# Patient Record
Sex: Female | Born: 1960 | Race: Black or African American | Hispanic: No | Marital: Single | Smoking: Never smoker
Health system: Southern US, Community
[De-identification: ages and names within clinical notes are randomized; demographics above are authoritative.]

## PROBLEM LIST (undated history)

## (undated) DIAGNOSIS — Z515 Encounter for palliative care: Secondary | ICD-10-CM

## (undated) DIAGNOSIS — G35 Multiple sclerosis: Secondary | ICD-10-CM

## (undated) DIAGNOSIS — N319 Neuromuscular dysfunction of bladder, unspecified: Secondary | ICD-10-CM

## (undated) DIAGNOSIS — J189 Pneumonia, unspecified organism: Secondary | ICD-10-CM

## (undated) DIAGNOSIS — F09 Unspecified mental disorder due to known physiological condition: Secondary | ICD-10-CM

## (undated) HISTORY — PX: KNEE ARTHROSCOPY: SHX127

## (undated) HISTORY — PX: APPENDECTOMY: SHX54

## (undated) HISTORY — PX: COLON SURGERY: SHX602

---

## 1998-07-27 ENCOUNTER — Emergency Department (HOSPITAL_COMMUNITY): Admission: EM | Admit: 1998-07-27 | Discharge: 1998-07-27 | Payer: Self-pay | Admitting: Emergency Medicine

## 1999-02-17 ENCOUNTER — Emergency Department (HOSPITAL_COMMUNITY): Admission: EM | Admit: 1999-02-17 | Discharge: 1999-02-17 | Payer: Self-pay | Admitting: Emergency Medicine

## 1999-02-17 ENCOUNTER — Encounter: Payer: Self-pay | Admitting: Emergency Medicine

## 1999-08-10 ENCOUNTER — Emergency Department (HOSPITAL_COMMUNITY): Admission: EM | Admit: 1999-08-10 | Discharge: 1999-08-10 | Payer: Self-pay | Admitting: Emergency Medicine

## 1999-11-18 ENCOUNTER — Encounter: Admission: RE | Admit: 1999-11-18 | Discharge: 1999-11-18 | Payer: Self-pay | Admitting: Neurology

## 1999-11-18 ENCOUNTER — Encounter: Payer: Self-pay | Admitting: Neurology

## 1999-12-29 ENCOUNTER — Encounter: Admission: RE | Admit: 1999-12-29 | Discharge: 2000-01-19 | Payer: Self-pay | Admitting: Neurology

## 2000-05-16 ENCOUNTER — Inpatient Hospital Stay (HOSPITAL_COMMUNITY): Admission: AD | Admit: 2000-05-16 | Discharge: 2000-05-21 | Payer: Self-pay | Admitting: Neurology

## 2000-05-25 ENCOUNTER — Encounter: Admission: RE | Admit: 2000-05-25 | Discharge: 2000-08-23 | Payer: Self-pay | Admitting: Neurology

## 2000-10-17 ENCOUNTER — Ambulatory Visit (HOSPITAL_COMMUNITY): Admission: RE | Admit: 2000-10-17 | Discharge: 2000-10-17 | Payer: Self-pay | Admitting: Neurology

## 2000-10-17 ENCOUNTER — Encounter: Payer: Self-pay | Admitting: Neurology

## 2001-05-24 ENCOUNTER — Ambulatory Visit (HOSPITAL_COMMUNITY): Admission: RE | Admit: 2001-05-24 | Discharge: 2001-05-24 | Payer: Self-pay | Admitting: Family Medicine

## 2001-05-24 ENCOUNTER — Encounter: Payer: Self-pay | Admitting: Family Medicine

## 2003-04-14 ENCOUNTER — Emergency Department (HOSPITAL_COMMUNITY): Admission: EM | Admit: 2003-04-14 | Discharge: 2003-04-15 | Payer: Self-pay | Admitting: Emergency Medicine

## 2003-04-14 ENCOUNTER — Encounter: Payer: Self-pay | Admitting: Emergency Medicine

## 2006-10-07 ENCOUNTER — Encounter: Admission: RE | Admit: 2006-10-07 | Discharge: 2006-11-01 | Payer: Self-pay | Admitting: Neurology

## 2006-11-02 ENCOUNTER — Encounter: Admission: RE | Admit: 2006-11-02 | Discharge: 2006-12-01 | Payer: Self-pay | Admitting: Neurology

## 2008-10-31 ENCOUNTER — Encounter: Admission: RE | Admit: 2008-10-31 | Discharge: 2008-10-31 | Payer: Self-pay | Admitting: Internal Medicine

## 2008-11-28 ENCOUNTER — Encounter: Admission: RE | Admit: 2008-11-28 | Discharge: 2008-11-28 | Payer: Self-pay | Admitting: Internal Medicine

## 2009-04-25 HISTORY — PX: ABDOMINAL HYSTERECTOMY: SHX81

## 2009-04-25 HISTORY — PX: ABDOMINAL EXPLORATION SURGERY: SHX538

## 2009-05-19 ENCOUNTER — Inpatient Hospital Stay (HOSPITAL_COMMUNITY): Admission: RE | Admit: 2009-05-19 | Discharge: 2009-05-21 | Payer: Self-pay | Admitting: Obstetrics and Gynecology

## 2009-05-19 ENCOUNTER — Encounter (INDEPENDENT_AMBULATORY_CARE_PROVIDER_SITE_OTHER): Payer: Self-pay | Admitting: Obstetrics and Gynecology

## 2009-12-08 ENCOUNTER — Encounter (HOSPITAL_COMMUNITY): Admission: RE | Admit: 2009-12-08 | Discharge: 2010-01-27 | Payer: Self-pay | Admitting: Internal Medicine

## 2010-01-16 ENCOUNTER — Emergency Department (HOSPITAL_COMMUNITY): Admission: EM | Admit: 2010-01-16 | Discharge: 2010-01-16 | Payer: Self-pay | Admitting: Emergency Medicine

## 2010-02-03 ENCOUNTER — Encounter: Admission: RE | Admit: 2010-02-03 | Discharge: 2010-02-03 | Payer: Self-pay | Admitting: Internal Medicine

## 2010-07-24 ENCOUNTER — Encounter
Admission: RE | Admit: 2010-07-24 | Discharge: 2010-07-24 | Payer: Self-pay | Source: Home / Self Care | Attending: Otolaryngology | Admitting: Otolaryngology

## 2010-08-16 ENCOUNTER — Encounter: Payer: Self-pay | Admitting: Internal Medicine

## 2010-10-01 IMAGING — US US PELVIS COMPLETE
1 series · 13 of 25 positions shown · non-contrast
Comparison: None.

CLINICAL DATA: Fibroids.

TRANSABDOMINAL AND TRANSVAGINAL ULTRASOUND OF PELVIS
TECHNIQUE: Both transabdominal and transvaginal ultrasound
examinations of the pelvis were performed including evaluation of
the uterus, ovaries, adnexal regions, and pelvic cul-de-sac.

[Series 1: us pelvis complete · 0.29mm/px · 13 of 60 slices shown]
[im 1/60]
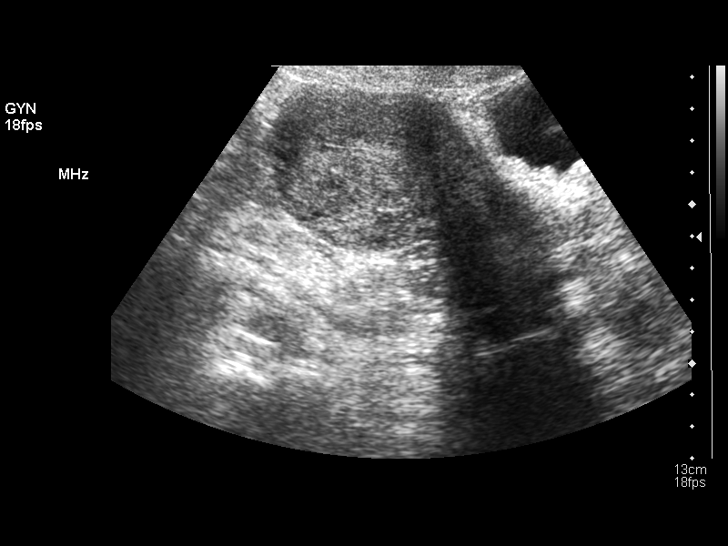
[im 5/60]
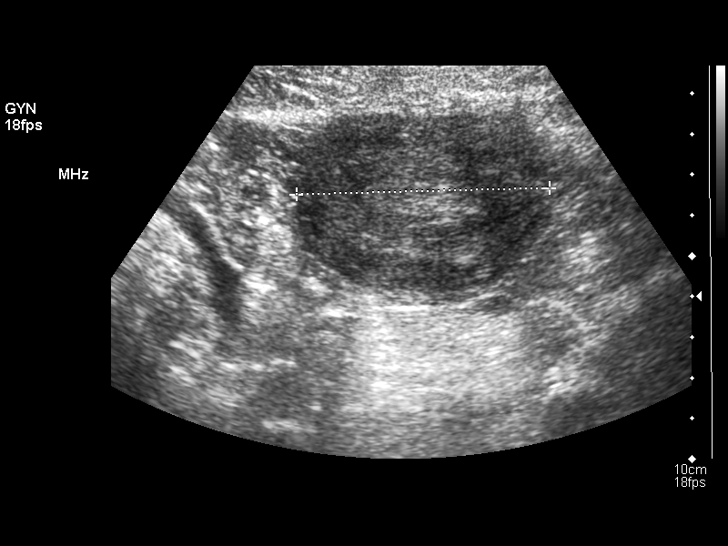
[im 10/60]
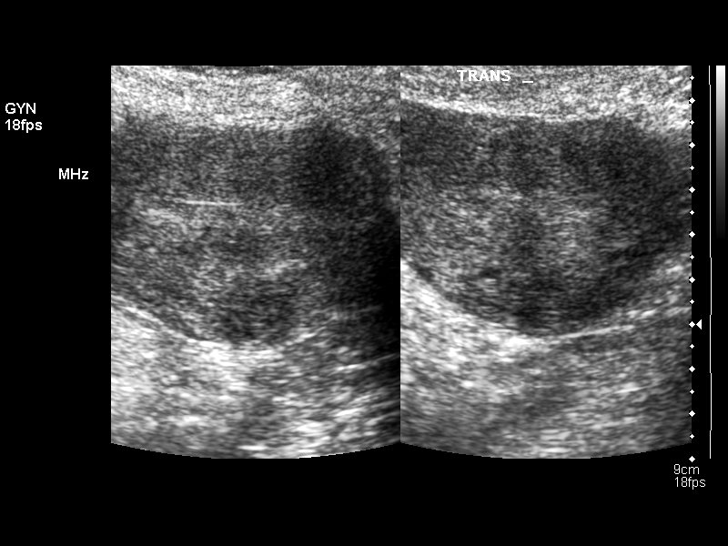
[im 15/60]
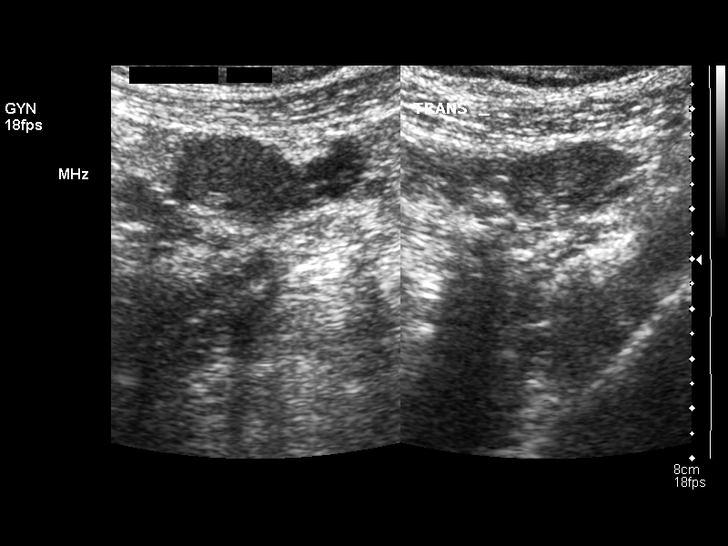
[im 20/60]
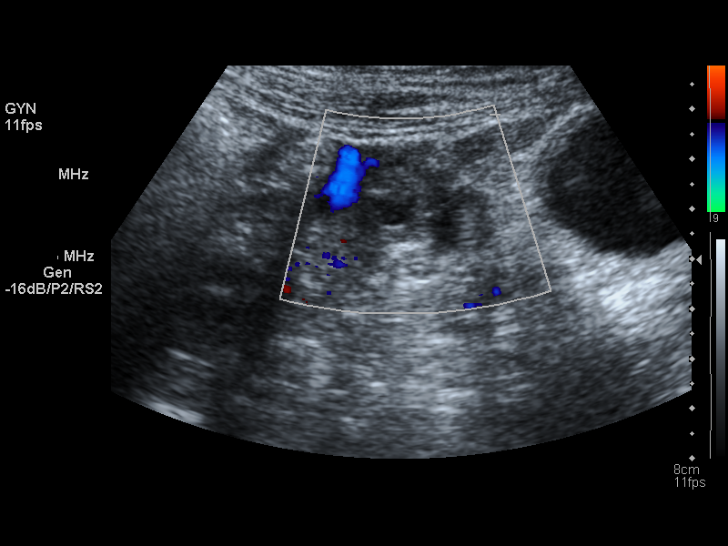
[im 25/60]
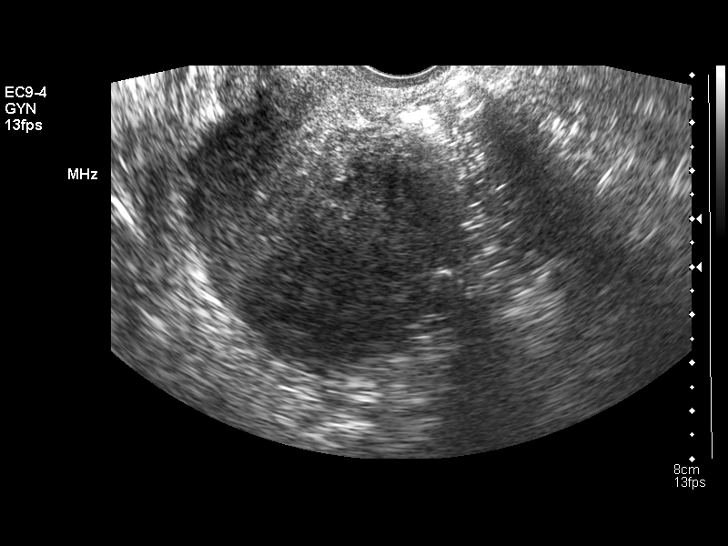
[im 30/60]
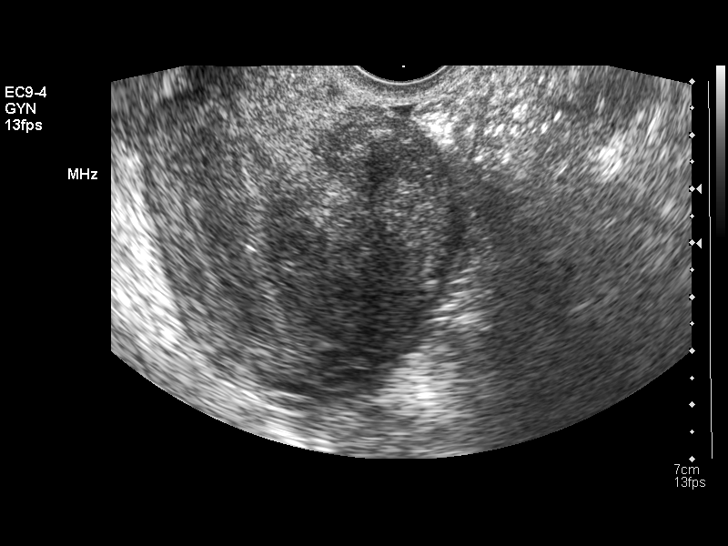
[im 35/60]
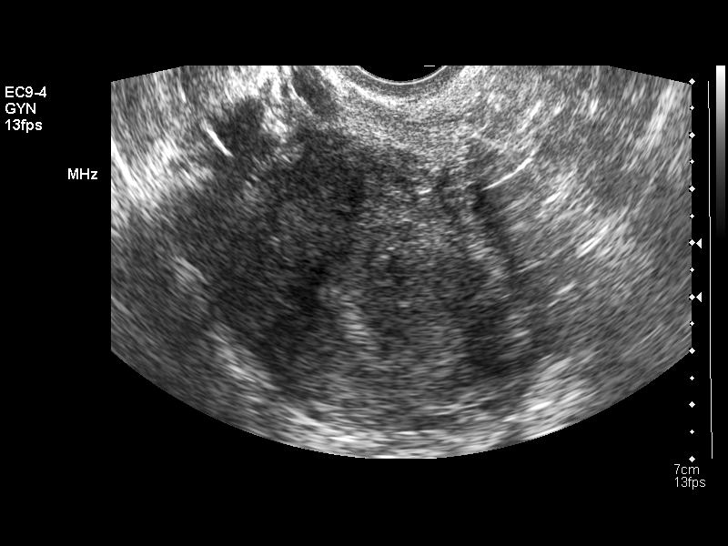
[im 40/60]
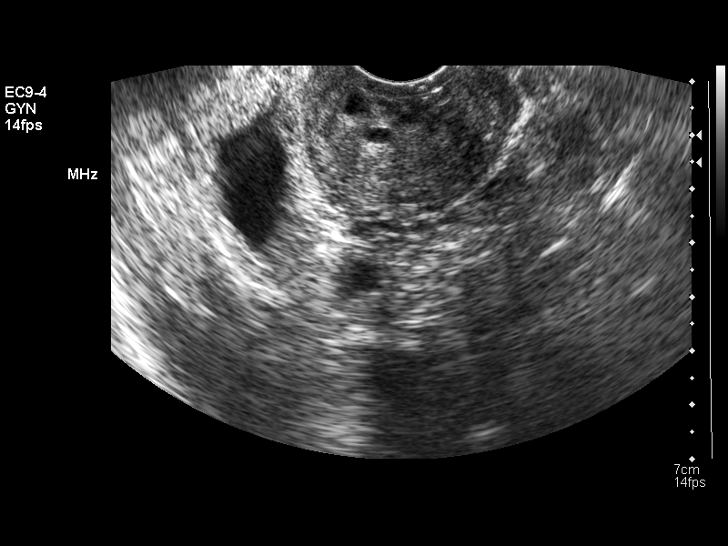
[im 45/60]
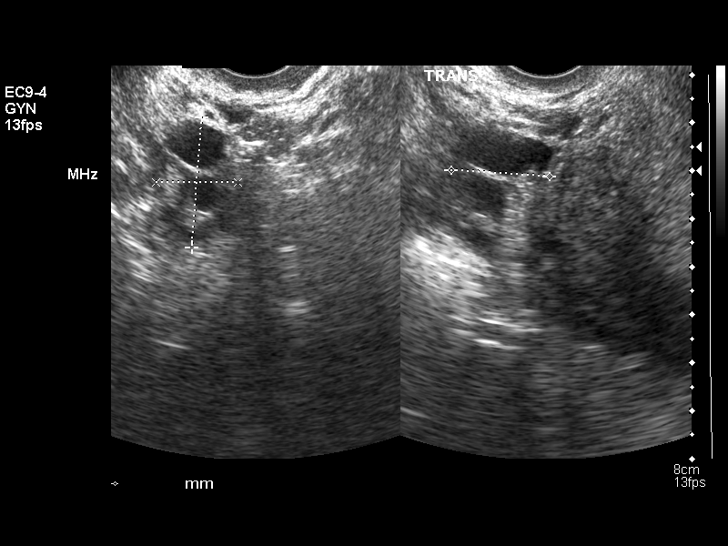
[im 50/60]
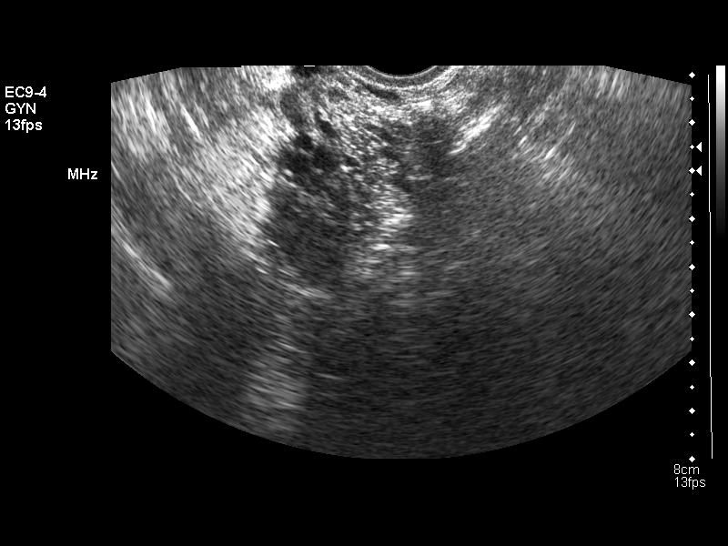
[im 55/60]
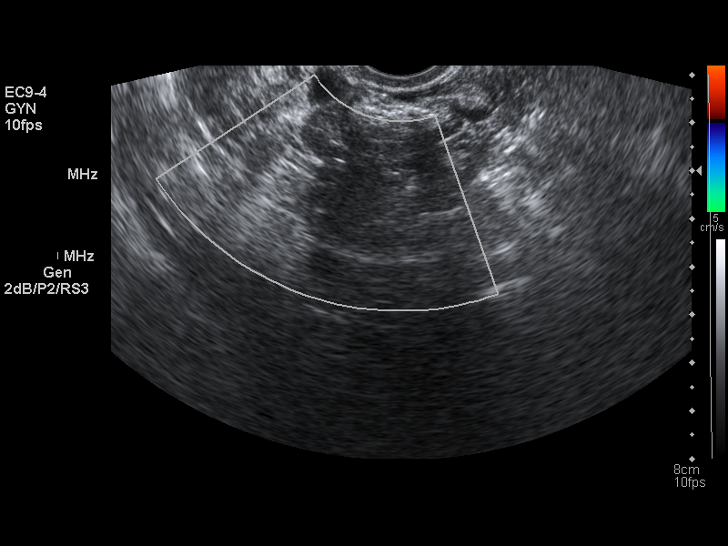
[im 60/60]
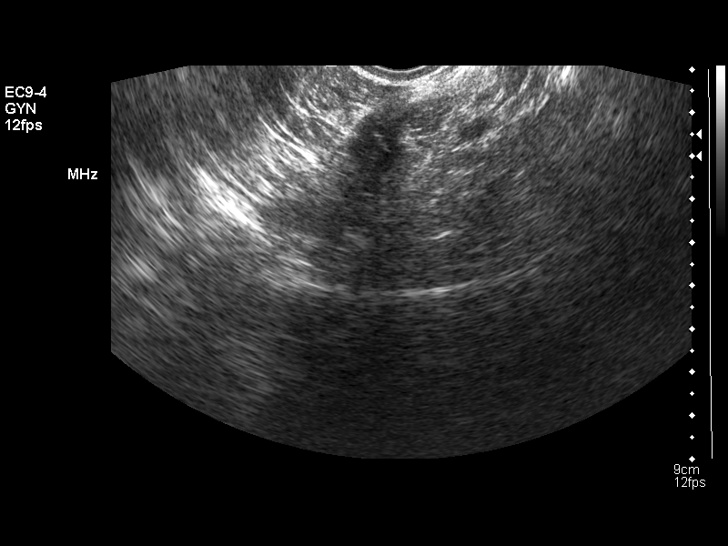

[13 of 25 positions shown; findings below may reference images not displayed]

FINDINGS: The uterus measures 9.9 x 5.1 x 6.2 cm.  Myometrial echotexture is
homogeneous.  2 cm left fundal intramural fibroid is associated
with a right-sided intramural 1.9 cm fibroid in the uterine body.
There is an area of heterogeneity in the deep posterior myometrium,
adjacent to the endometrial canal.  This has very poorly defined
margins.  It is possible it could represent a deep intramural to
submucosal fibroid, but somewhat focal adenomyosis would also be a
consideration.  Endometrial stripe thickness is 9 mm.

The right ovary measures 2.7 x 1.7 x 2.1 cm.  The left ovary is not
discretely visualized.  Right ovary has normal features.  No
evidence for intraperitoneal free fluid.
IMPRESSION: Two intramural fibroids as described, one on each side.  There is
an additional 3 cm area of poorly marginated myometrial
heterogeneity in the body of the posterior uterus, towards the
fundus.  This is probably related to adenomyosis although ill
defined fibroid is a less likely possibility.

Normal right ovary with nonvisualization of the left ovary probably
secondary to position or overlying bowel gas.

## 2010-10-29 LAB — BASIC METABOLIC PANEL
BUN: 7 mg/dL (ref 6–23)
CO2: 29 mEq/L (ref 19–32)
Calcium: 8.2 mg/dL — ABNORMAL LOW (ref 8.4–10.5)
Chloride: 105 mEq/L (ref 96–112)
Chloride: 106 mEq/L (ref 96–112)
GFR calc Af Amer: >60 mL/min (ref >60)
GFR calc Af Amer: >60 mL/min (ref >60)
GFR calc non Af Amer: >60 mL/min (ref >60)
Potassium: 4.1 mEq/L (ref 3.5–5.1)
Sodium: 138 mEq/L (ref 135–145)
Sodium: 138 mEq/L (ref 135–145)

## 2010-10-29 LAB — CBC
HCT: 29.8 % — ABNORMAL LOW (ref 36.0–46.0)
Hemoglobin: 9.6 g/dL — ABNORMAL LOW (ref 12.0–15.0)
MCHC: 31.9 g/dL (ref 30.0–36.0)
MCHC: 32.2 g/dL (ref 30.0–36.0)
Platelets: 351 10*3/uL (ref 150–400)
RDW: 17.7 % — ABNORMAL HIGH (ref 11.5–15.5)
WBC: 13.8 10*3/uL — ABNORMAL HIGH (ref 4.0–10.5)

## 2010-10-29 LAB — URINE CULTURE: Culture: NO GROWTH

## 2010-12-11 NOTE — Discharge Summary (Signed)
[place]  Patient:    Roberta Carpenter, Roberta Carpenter                        MRN: [id number] Adm. Date:  [id number] Disc. Date: [id number] Attending:  Lesly Dukes CC:         [facility], [place], [place]   Discharge Summary  PATIENT ADDRESS: [id number] [place], [place] [address]  DATE OF BIRTH: 05-23-1961  HISTORY OF PRESENT ILLNESS: This was one of several [facility] admissions for Roberta Carpenter, a 50 year old right-handed black female who lives with her mother in [place], [place] and has a history of multiple sclerosis.  She has been followed for multiple sclerosis and is admitted for exacerbation.  This patient was first seen in April 2001 with significant problems related to her gait and a four to five year history of progressive difficulty ambulating. She also had developed symptoms suggestive of a neurogenic bladder.  She was placed on Betaseron for multiple sclerosis.  She had been having worsening of her memory and evidence of a mild organic brain syndrome, and due to worsening of her condition was admitted on May 16, 2000 by Dr. Anne Hahn for high-dose IV Solu-Medrol therapy.  PAST MEDICAL HISTORY:  1. Multiple sclerosis.  2. Arthroscopy, left knee.  3. Neurogenic bladder.  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  ADMISSION MEDICATIONS:  1. Betaseron 1 cc every other day subcutaneously.  2. Ditropan XL 10 mg 1 q.d.  3. Vitamin C 500 mg p.o. t.i.d.  ALLERGIES: No known drug allergies.  SOCIAL HISTORY: She smokes three cigarettes per day.  She does not drink alcohol.  She has two daughters.  She has a ninth grade education.  She is currently unemployed.  She walks with a walker and lives with her mother.  FAMILY HISTORY: There is a positive family history of high blood pressure.  PHYSICAL EXAMINATION:  GENERAL: Examination at the time of admission revealed the  patient to be a well-developed thin black female.  She was alert and cooperative.  Her general examination was unremarkable.  VITAL SIGNS: Blood pressure 116/72, heart rate 76 and regular.  She was afebrile.  NEUROLOGIC: Mental status showed her to be alert and oriented x 3.  Cranial nerves showed evidence of bilateral internuclear ophthalmoplegia, left greater than right.  The pupils were reactive.  Discs were flat.  There was no optic atrophy.  She had weakness in the distal muscles of her arms and intrinsic muscles, with 3/5 hip extension worse on the left than the right, and 4/5 extensors at the knee.  She had bilateral foot drop and 4-/5 range, worse on the left than the right, with decreased pinprick in the left arm and left leg as compared to the right.  Increased reflexes on the left as compared to the right.  Plantar responses appeared to be neutral.  LABORATORY DATA: A 12 lead EKG showed normal sinus rhythm, normal 12 lead EKG. A 2D echocardiogram showed overall left ventricular systolic function to be normal, estimated left ventricular fraction in the range of 55-65%, left ventricular wall thickness normal.  PT was 13.5, INR 1.1, PTT 27.  Glucose was 132, BUN 16, creatinine 0.8. Sodium 139, potassium 3.5, chloride 106, CO2 content 26, calcium 8.7, total protein 6.7, albumin 3.7, AST 20, ALT 11, ALP 55, total bilirubin 0.6. Urinalysis revealed moderate amount of leukocyte esterase, many epithelial cells;  21-50 wbc/hpf; 0-5 rbc/hpf; many bacteria, with culture growing 50,000 organisms of mixed species, and repeat urine culture and urinalysis by catheterization to be obtained.  Estimated sedimentation rate was 20.  A CBC revealed a hemoglobin of 12.3, hematocrit 34.9, WBC 8300; platelet count 240,000.  HOSPITAL COURSE: The patient was admitted and treated with high-dose IV Solu-Medrol x 5 days.  She tolerated the treatment well and noted significant gains in her ability to  ambulate in the hospital.  She continued, however, to have an INO that was predominantly on the left more than the right.  She continued to walk with her walker.  She continued to have some urinalysis symptomatology.  She was seen in consultation by OT and PT, who felt that outpatient OT and PT were appropriate for the patient.  She was seen in consultation by Dr. Thomasena Edis, rehabilitation physician, who felt the patient was very close to her preadmission status with the exception of increased fatigability and vision with INO, and the patient likely would not benefit from acute rehabilitation service.  It was felt she should be treated as an outpatient.  It was felt that she could use orthotics and a wheelchair at the time of discharge for home use.  She remained mildly euphoric in the hospital. Her last examination on May 21, 2000 revealed a temperature of 97.9 degrees, heart rate of 58, respiratory rate of 20, and blood pressure 110/54. Oxygen saturation was 96%.  She was alert and oriented.  She was euphoric. Her cranial nerve examination revealed discs were flat and no optic atrophy. There was left ptosis and bilateral INO, left greater than right.  There was mild dysarthria.  Motor examination revealed weakness distally greater than proximally in upper and lower extremities, with spooning of her hands bilaterally.  There was weakness with left foot drop greater than right foot drop.  She had decreased sensation to pinprick in her left hand, arm, and left leg as compared to the right.  Plantar responses were neutral.  IMPRESSION:  1. Multiple sclerosis, code 340.  2. Gait disorder secondary to #1, code 781.2.  3. Organic brain syndrome secondary to #1, code 294.9.  4. Neurogenic bladder.  5. Possible urinary tract infection, code 599.0.  DISCHARGE MEDICATIONS:  1. Betaseron 1 cc every other day subcutaneously.  2. Prednisone 40 mg q.d. taper, cutting down by 1 5 mg tablet every  three     days until completion of taper.  3. Vitamin C 500 mg t.i.d.   4. Ditropan XL 10 mg 1 q.d.  5. Zantac 150 mg p.o. b.i.d.  6. Calcium or Os-Cal with vitamin D 500 mg t.i.d.  7. Cipro 500 mg b.i.d. x 10 days.  FOLLOW-UP: She is to have physical therapy and occupational therapy as an outpatient.  She can have a wheelchair as an outpatient if needed.  She will return to see Dr. Anne Hahn in three weeks. DD:  05/21/00 TD:  05/22/00 Job: [id number]

## 2010-12-25 HISTORY — PX: EXPLORATORY LAPAROTOMY: SUR591

## 2011-01-04 ENCOUNTER — Inpatient Hospital Stay (HOSPITAL_COMMUNITY)
Admission: EM | Admit: 2011-01-04 | Discharge: 2011-01-13 | DRG: 336 | Disposition: A | Discharge status: Home Health | Payer: Medicare Other | Attending: Surgery | Admitting: Surgery

## 2011-01-04 ENCOUNTER — Emergency Department (HOSPITAL_COMMUNITY): Payer: Medicare Other

## 2011-01-04 DIAGNOSIS — R188 Other ascites: Secondary | ICD-10-CM | POA: Diagnosis present

## 2011-01-04 DIAGNOSIS — R5381 Other malaise: Secondary | ICD-10-CM | POA: Diagnosis present

## 2011-01-04 DIAGNOSIS — N390 Urinary tract infection, site not specified: Secondary | ICD-10-CM | POA: Diagnosis present

## 2011-01-04 DIAGNOSIS — K56609 Unspecified intestinal obstruction, unspecified as to partial versus complete obstruction: Principal | ICD-10-CM | POA: Diagnosis present

## 2011-01-04 DIAGNOSIS — Z87891 Personal history of nicotine dependence: Secondary | ICD-10-CM

## 2011-01-04 DIAGNOSIS — G35 Multiple sclerosis: Secondary | ICD-10-CM | POA: Diagnosis present

## 2011-01-04 DIAGNOSIS — I10 Essential (primary) hypertension: Secondary | ICD-10-CM | POA: Diagnosis present

## 2011-01-04 LAB — URINALYSIS, ROUTINE W REFLEX MICROSCOPIC
Glucose, UA: NEGATIVE mg/dL
Hgb urine dipstick: NEGATIVE
Protein, ur: NEGATIVE mg/dL
Specific Gravity, Urine: 1.025 (ref 1.005–1.030)
pH: 6 (ref 5.0–8.0)

## 2011-01-04 LAB — POCT I-STAT, CHEM 8
BUN: 17 mg/dL (ref 6–23)
Chloride: 105 mEq/L (ref 96–112)
HCT: 48 % — ABNORMAL HIGH (ref 36.0–46.0)
Hemoglobin: 16.3 g/dL — ABNORMAL HIGH (ref 12.0–15.0)
Sodium: 142 mEq/L (ref 135–145)
TCO2: 28 mmol/L (ref 0–100)

## 2011-01-04 LAB — CBC
HCT: 41.4 % (ref 36.0–46.0)
MCHC: 35.5 g/dL (ref 30.0–36.0)
RBC: 4.84 MIL/uL (ref 3.87–5.11)
RDW: 14.6 % (ref 11.5–15.5)

## 2011-01-04 MED ORDER — IOHEXOL 300 MG/ML  SOLN
100.0000 mL | Freq: Once | INTRAMUSCULAR | Status: AC | PRN
  Administered 2011-01-04: 100 mL via INTRAVENOUS

## 2011-01-05 LAB — BASIC METABOLIC PANEL
BUN: 14 mg/dL (ref 6–23)
Sodium: 140 mEq/L (ref 135–145)

## 2011-01-05 LAB — URINE CULTURE
Colony Count: NO GROWTH
Culture  Setup Time: 201206111827
Culture: NO GROWTH

## 2011-01-05 LAB — CBC
HCT: 39 % (ref 36.0–46.0)
Hemoglobin: 13.1 g/dL (ref 12.0–15.0)
MCH: 28.9 pg (ref 26.0–34.0)
MCV: 86.1 fL (ref 78.0–100.0)
Platelets: 292 10*3/uL (ref 150–400)
RBC: 4.53 MIL/uL (ref 3.87–5.11)
WBC: 16.5 10*3/uL — ABNORMAL HIGH (ref 4.0–10.5)

## 2011-01-06 LAB — COMPREHENSIVE METABOLIC PANEL
ALT: 10 U/L (ref 0–35)
Alkaline Phosphatase: 84 U/L (ref 39–117)
Creatinine, Ser: <0.47 mg/dL (ref 0.4–1.2)
Sodium: 140 mEq/L (ref 135–145)
Total Bilirubin: 0.9 mg/dL (ref 0.3–1.2)

## 2011-01-06 LAB — CBC
Hemoglobin: 11.8 g/dL — ABNORMAL LOW (ref 12.0–15.0)
MCHC: 34 g/dL (ref 30.0–36.0)
MCV: 86.1 fL (ref 78.0–100.0)
Platelets: 240 10*3/uL (ref 150–400)
RBC: 4.03 MIL/uL (ref 3.87–5.11)
WBC: 11.9 10*3/uL — ABNORMAL HIGH (ref 4.0–10.5)

## 2011-01-06 LAB — MAGNESIUM: Magnesium: 1.7 mg/dL (ref 1.5–2.5)

## 2011-01-07 NOTE — Op Note (Signed)
NAMEGRACELYN, Roberta Carpenter NO.:  [id number]  MEDICAL RECORD NO.:  [id number]  LOCATION:  5120                         FACILITY:  [facility]  PHYSICIAN:  Abigail Miyamoto, M.D. DATE OF BIRTH:  1961-05-29  DATE OF PROCEDURE:  01/05/2011 DATE OF DISCHARGE:                              OPERATIVE REPORT   PREOPERATIVE DIAGNOSIS:  Small bowel obstruction.  POSTOPERATIVE DIAGNOSIS:  Small bowel obstruction.  PROCEDURES: 1. Exploratory laparotomy. 2. Lysis of adhesions.  SURGEON:  Abigail Miyamoto, MD  ASSISTANT:  Gabrielle Dare. Janee Morn, MD  ANESTHESIA:  General endotracheal anesthesia.  ESTIMATED BLOOD LOSS:  Minimal.  INDICATIONS:  Roberta Carpenter is a 50 year old female who presented with acute abdominal pain.  She was found on the CAT scan of the abdomen and pelvis to have a loop small bowel fixed in the pelvis with findings worrisome for a small bowel obstruction with small bowel volvulus and ascites.  Given her tenderness on examination and elevated white blood count, a decision was made proceed to the operating room.  FINDINGS:  The patient found to have a small bowel obstruction from adhesions with small bowel loop fixed in the pelvis.  There was no evidence of bowel ischemia or stricture.  The obstruction was relieved after the adhesions were lysed.  PROCEDURE IN DETAIL:  The patient was brought to the operative, identified as Roberta Carpenter.  She was placed supine on the operative table and general anesthesia was induced.  Her abdomen was then prepped and draped in the usual sterile fashion.  Using a 15 blade, a lower midline incision was then created.  This was taken down through the subcutaneous tissue with electrocautery.  Fascia was then identified and opened with the electrocautery.  The underlying peritoneum was then opened in entire length of the incision.  Upon entering the abdomen, the patient had a moderate amount of ascites which was suctioned out.  I then  eviscerated the small bowel.  The patient had adhesions of omentum to the left upper quadrant.  I followed the small bowel down into the pelvis.  The patient had large amount of adhesions down the pelvis.  The small bowel stuck to the ovary on the right side which was also cystic in appearance as well as the sigmoid colon.  This created a volvulus.  I was able to lyse the adhesions sharply with Metzenbaum scissors and free up all small bowel from the pelvis.  This helped to relieve the obstruction.  The small bowel was examined thoroughly and no areas of ischemia or stricture was identified.  The appendix was also stuck to the ovary and I took this off with the electrocautery.  I again ran the small bowel its entire length and found no further evidence of obstructions once the adhesions were lysed.  I then thoroughly irrigated the abdomen with normal saline. I freed up the omentum and colon stuck to the abdominal wall fascia with the electrocautery as well.  Once hemostasis was achieved, I then closed the patient's midline fascia with running #1 looped PDS suture.  The skin was then irrigated and closed with skin staples.  Gauze and tape were then applied.  The  patient tolerated the procedure well.  All counts were correct at the end of the procedure.  The patient was then extubated in operating room and taken in stable condition to recovery room.     Abigail Miyamoto, M.D.     DB/MEDQ  D:  01/05/2011  T:  01/05/2011  Job:  [id number]  Electronically Signed by Abigail Miyamoto M.D. on 01/07/2011 07:06:33 AM

## 2011-01-07 NOTE — H&P (Signed)
Roberta Carpenter, Roberta Carpenter NO.:  [id number]  MEDICAL RECORD NO.:  [id number]  LOCATION:  5120                         FACILITY:  [facility]  PHYSICIAN:  Abigail Miyamoto, M.D. DATE OF BIRTH:  Aug 18, 1960  DATE OF ADMISSION:  01/04/2011 DATE OF DISCHARGE:                             HISTORY & PHYSICAL   CHIEF COMPLAINTS:  Abdominal pain and nausea.  HISTORY:  This is a 50 year old female who presents with 1-day history of abdominal pain and nausea.  She reports that the pain came on this morning.  She said it was quite severe.  She was having cramping with this and nausea, but no emesis.  Her last bowel movement was yesterday. She has had no bowel movement, has passed no flatus all day today. Otherwise, she has no complaints.  She will have decreased pain when she is getting IV pain medications, but otherwise is uncomfortable.  Past medical history and past surgical history is significant for MS and hysterectomy.  FAMILY HISTORY:  Significant for diabetes and hypertension.  ALLERGIES:  No known drug allergies.  MEDICATIONS:  Please see her medical reconciliation form.  SOCIAL HISTORY:  Positive for smoking, is negative for alcohol use.  REVIEW OF SYSTEMS:  GENERAL:  Negative for fever or chills.  PULMONARY: Negative for cough, shortness breath, difficulty breathing.  CARDIAC: Negative for chest pain, irregular heartbeat.  ABDOMEN:  Listed as above.  URINARY:  Negative for dysuria or hematuria.  The rest review of systems skin, eyes, ears, nose, throat, musculoskeletal, neurologic, psychiatric, endocrine negative except for deficits for multiple sclerosis.  PHYSICAL EXAMINATION:  GENERAL:  This is a thin female in mild discomfort. VITAL SIGNS:  Temperature is 97.9, pulse 66, respiratory rate 16, blood pressure is 128/87. HEENT:  Eyes, she has asymmetric pupils.  Her vision does appear normal. Extraocular muscles are intact.  Her eyes are anicteric.  Oropharynx  is clear. NECK:  Supple.  Trachea is midline.  There is no thyromegaly. LUNGS:  Clear to auscultation bilaterally.  Normal respiratory effort. CARDIOVASCULAR:  Regular rate and rhythm.  There are no murmurs.  There is no peripheral edema. ABDOMEN:  Distended, diffusely tender.  She has a well-healed incision at her umbilicus as well as a lower Pfannenstiel incision.  There is guarding throughout.  There are no hernias. SKIN:  No rash and no jaundice. EXTREMITIES:  Warm, well-perfused.  No edema, cyanosis.  Peripheral pulses are intact in all four extremities. MUSCULOSKELETAL:  Normal motor and sensory function, grossly intact in all four extremities with the patient lying in bed. PSYCHIATRIC:  Shows her to be awake, alert, and oriented.  Judgment and affect appeared normal.  DATA REVIEWED:  The patient has a white blood count of 15.9 with a left shift.  CAT scan of the abdomen and pelvis shows a dilated loop of small bowel going down to the pelvis, which has collapsed distally.  There is ascites with this and thickening of the bowel wall loop.  IMPRESSION:  This patient has small bowel obstruction with acute abdomen.  Plan will be to proceed emergently to the operating room for exploratory laparotomy.  I have discussed with the patient in detail.  I discussed the risk of surgery, which include but not limited to bleeding, infection, injury to surrounding structures, need for bowel resection, etc.  She understands and wished to proceed.  Surgery will thus be scheduled.     Abigail Miyamoto, M.D.     DB/MEDQ  D:  01/04/2011  T:  01/05/2011  Job:  [id number]  Electronically Signed by Abigail Miyamoto M.D. on 01/07/2011 07:06:30 AM

## 2011-01-08 ENCOUNTER — Inpatient Hospital Stay (HOSPITAL_COMMUNITY): Payer: Medicare Other

## 2011-01-08 LAB — URINALYSIS, ROUTINE W REFLEX MICROSCOPIC
Glucose, UA: NEGATIVE mg/dL
Hgb urine dipstick: NEGATIVE
Ketones, ur: NEGATIVE mg/dL
Leukocytes, UA: NEGATIVE
Protein, ur: NEGATIVE mg/dL
pH: 7.5 (ref 5.0–8.0)

## 2011-01-08 LAB — BASIC METABOLIC PANEL
BUN: 3 mg/dL — ABNORMAL LOW (ref 6–23)
CO2: 25 mEq/L (ref 19–32)
Calcium: 8.3 mg/dL — ABNORMAL LOW (ref 8.4–10.5)
Glucose, Bld: 107 mg/dL — ABNORMAL HIGH (ref 70–99)

## 2011-01-09 LAB — URINE CULTURE
Colony Count: NO GROWTH
Culture  Setup Time: 201206151817
Culture: NO GROWTH

## 2011-01-10 LAB — BASIC METABOLIC PANEL
BUN: <3 mg/dL — ABNORMAL LOW (ref 6–23)
Calcium: 8.2 mg/dL — ABNORMAL LOW (ref 8.4–10.5)
Glucose, Bld: 102 mg/dL — ABNORMAL HIGH (ref 70–99)
Potassium: 3.3 mEq/L — ABNORMAL LOW (ref 3.5–5.1)
Sodium: 138 mEq/L (ref 135–145)

## 2011-01-12 LAB — BASIC METABOLIC PANEL
Calcium: 8.7 mg/dL (ref 8.4–10.5)
GFR calc Af Amer: >60 mL/min (ref >60)
GFR calc non Af Amer: >60 mL/min (ref >60)
Potassium: 4.1 mEq/L (ref 3.5–5.1)
Sodium: 140 mEq/L (ref 135–145)

## 2011-01-12 LAB — URINALYSIS, ROUTINE W REFLEX MICROSCOPIC
Nitrite: NEGATIVE
Protein, ur: NEGATIVE mg/dL
Specific Gravity, Urine: 1.007 (ref 1.005–1.030)
Urobilinogen, UA: 1 mg/dL (ref 0.0–1.0)

## 2011-01-12 LAB — CBC
Hemoglobin: 11.6 g/dL — ABNORMAL LOW (ref 12.0–15.0)
MCH: 29.1 pg (ref 26.0–34.0)
MCHC: 34.1 g/dL (ref 30.0–36.0)
Platelets: 294 10*3/uL (ref 150–400)

## 2011-01-12 LAB — URINE MICROSCOPIC-ADD ON

## 2011-01-14 ENCOUNTER — Other Ambulatory Visit (INDEPENDENT_AMBULATORY_CARE_PROVIDER_SITE_OTHER): Payer: Self-pay | Admitting: Surgery

## 2011-01-15 LAB — URINE CULTURE
Colony Count: >=100000
Culture  Setup Time: 201206191553

## 2011-01-18 ENCOUNTER — Encounter (INDEPENDENT_AMBULATORY_CARE_PROVIDER_SITE_OTHER): Payer: Self-pay | Admitting: Surgery

## 2011-01-19 ENCOUNTER — Ambulatory Visit (INDEPENDENT_AMBULATORY_CARE_PROVIDER_SITE_OTHER): Payer: Medicare Other | Admitting: Surgery

## 2011-01-19 ENCOUNTER — Encounter (INDEPENDENT_AMBULATORY_CARE_PROVIDER_SITE_OTHER): Payer: Self-pay | Admitting: Surgery

## 2011-01-19 DIAGNOSIS — Z9889 Other specified postprocedural states: Secondary | ICD-10-CM

## 2011-01-19 DIAGNOSIS — Z8719 Personal history of other diseases of the digestive system: Secondary | ICD-10-CM | POA: Insufficient documentation

## 2011-01-19 NOTE — Progress Notes (Signed)
Subjective:     Patient ID: Roberta Carpenter, female   DOB: 1960-12-12, 50 y.o.   MRN: [id number]    BP 115/72  Pulse 77  Temp(Src) 97.7 F (36.5 C) (Temporal)  Ht 5\' 4"  (1.626 m)  Wt 120 lb 12.8 oz (54.795 kg)  BMI 20.74 kg/m2    HPI She is 2 weeks status post exploratory laparotomy for a small bowel obstruction. Prior to discharge she was placed on antibiotics for urinary  tract infection. She is now doing well. She is eating well and moving her bowels well. She has no urinary symptoms. She is almost finished with her antibiotics.  Review of Systems     Objective:   Physical Exam    On exam, her abdomen is soft and her incision is well-healed. We removed her staples. Assessment:        Patient status post exploratory laparotomy for small bowel obstruction with lysis of adhesions. Plan:       She will refrain from heavy lifting for 4 weeks. She may resume all other activity. She will finish her antibiotics. She will followup with [place] as needed.

## 2011-02-02 NOTE — Discharge Summary (Signed)
Roberta Carpenter, QUINTEROS NO.:  [id number]  MEDICAL RECORD NO.:  [id number]  LOCATION:  5120                         FACILITY:  [facility]  PHYSICIAN:  Velora Heckler, MD      DATE OF BIRTH:  12-04-1960  DATE OF ADMISSION:  01/04/2011 DATE OF DISCHARGE:  01/13/2011                        DISCHARGE SUMMARY - REFERRING   ADMITTING DIAGNOSES: 1. Small bowel obstruction. 2. Hypertension. 3. Multiple sclerosis.  DISCHARGE DIAGNOSES: 1. Small bowel obstruction. 2. Hypertension. 3. Multiple sclerosis. 4. Deconditioning. 5. Urinary tract infection. 6. History of tobacco use.  PROCEDURES:  Exploratory laparotomy and lysis of adhesion, January 05, 2011, Dr. Magnus Ivan.  BRIEF HISTORY:  The patient is a 50 year old female who lives at home with her mother and has multiple sclerosis.  She developed pain 24 hours prior to admission and had no flatus or BM for approximately 24 hours.  She was seen in the ER at [facility] with an elevated white count of 15.9.  CT scan showed a dilated loop of small bowel going down into the pelvis which collapsed.  It was his opinion she had a small bowel obstruction with an acute abdomen.  He recommended emergent surgery and she was admitted for the same.  PAST MEDICAL HISTORY: 1. History of diabetes but she is not on treatment for that. 2. History of hypertension. 3. Multiple sclerosis.  ALLERGIES:  None.  MEDICATIONS:  Please see discharge list.  HOSPITAL COURSE:  The patient was admitted, taken to the operating room and underwent exploratory laparotomy and lysis of adhesions.  The patient tolerated procedure well and returned to the floor in satisfactory condition.  Postoperatively, she has had a rather prolonged course with an extended ileus and a fair amount of deconditioning.  She apparently has not had any home therapy with her multiple sclerosis for some time.  She has eventually become more mobile with PT and OT.   Her diet has finally been able to be advanced and by January 12, 2011, she was doing much better and tolerating a soft diet.  She did have an elevated white count with no apparent source.  Because of her multiple sclerosis, she voids into a [employer] garment.  We checked UA which subsequently showed too numerous to count white cells and 11-20 red cells per high- powered field.  Urine culture has been requested.  We are going to start her empirically on Cipro.  From a surgical standpoint, she is doing quite well.  Her wounds look good.  Her abdomen is soft, still slightly distended but she does not think it is beyond normal.  She is walking with OT and PT.  Home health equipment including bed and a bedside commode have been requested by her mother and case manager and Home Health is working those details out now.  At this point with Dr. Ardine Eng opinion, the patient was ready for discharge and she was anxious to go.  DISCHARGE MEDS:  She will continue her baclofen 10 mg b.i.d., Ditropan XL 10 mg b.i.d., gabapentin 1 capsule p.o. b.i.d., iron over-the-counter 1 tablet daily, loratadine 1 tablet p.r.n.  New will be Tylenol 650 mg q.4 h. p.r.n. for  pain.  We are going to put her on Cipro 500 mg p.o. b.i.d. for 7 days, hydrocodone/APAP 1-2 p.o. q.4 h. p.r.n. for pain as needed.  We will have her come back and have her staples removed, check the urine culture next week.  I will have her see Dr. Magnus Ivan in 2 weeks.  Our office will call and make those appointments.  The patient is instructed to call Dr. Royston Sinner in [facility] and Dr. Clyda Greener in [place] for followup in their offices, hopefully Dr. Bruna Potter in about 1 week so he can follow up on the urinary tract infections.  CONDITION ON DISCHARGE:  Improving.     Eber Hong, P.A.   ______________________________ Velora Heckler, MD    WDJ/MEDQ  D:  01/13/2011  T:  01/13/2011  Job:  [id number]  cc:   Martie Round, MD  Electronically Signed by Sherrie George P.A. on 01/18/2011 03:22:16 PM Electronically Signed by Darnell Level MD on 02/02/2011 10:54:07 AM

## 2011-02-23 ENCOUNTER — Telehealth (INDEPENDENT_AMBULATORY_CARE_PROVIDER_SITE_OTHER): Payer: Self-pay | Admitting: Surgery

## 2011-02-23 NOTE — Telephone Encounter (Signed)
Called kelly on PT for pt to do 2-3 weeks

## 2013-06-28 ENCOUNTER — Encounter (HOSPITAL_COMMUNITY): Payer: Self-pay | Admitting: Radiology

## 2013-06-28 ENCOUNTER — Inpatient Hospital Stay (HOSPITAL_COMMUNITY)
Admission: EM | Admit: 2013-06-28 | Discharge: 2013-07-02 | DRG: 871 | Disposition: A | Discharge status: Home Health | Payer: Medicare Other | Attending: Internal Medicine | Admitting: Internal Medicine

## 2013-06-28 ENCOUNTER — Emergency Department (HOSPITAL_COMMUNITY): Payer: Medicare Other

## 2013-06-28 DIAGNOSIS — E876 Hypokalemia: Secondary | ICD-10-CM | POA: Diagnosis present

## 2013-06-28 DIAGNOSIS — G8220 Paraplegia, unspecified: Secondary | ICD-10-CM | POA: Diagnosis present

## 2013-06-28 DIAGNOSIS — I10 Essential (primary) hypertension: Secondary | ICD-10-CM | POA: Diagnosis present

## 2013-06-28 DIAGNOSIS — A419 Sepsis, unspecified organism: Secondary | ICD-10-CM | POA: Insufficient documentation

## 2013-06-28 DIAGNOSIS — N12 Tubulo-interstitial nephritis, not specified as acute or chronic: Secondary | ICD-10-CM

## 2013-06-28 DIAGNOSIS — G35 Multiple sclerosis: Secondary | ICD-10-CM

## 2013-06-28 DIAGNOSIS — Z8719 Personal history of other diseases of the digestive system: Secondary | ICD-10-CM

## 2013-06-28 DIAGNOSIS — N390 Urinary tract infection, site not specified: Secondary | ICD-10-CM | POA: Diagnosis present

## 2013-06-28 DIAGNOSIS — K5641 Fecal impaction: Secondary | ICD-10-CM | POA: Diagnosis present

## 2013-06-28 DIAGNOSIS — R000 Tachycardia, unspecified: Secondary | ICD-10-CM | POA: Diagnosis present

## 2013-06-28 DIAGNOSIS — Z9889 Other specified postprocedural states: Secondary | ICD-10-CM

## 2013-06-28 DIAGNOSIS — E870 Hyperosmolality and hypernatremia: Secondary | ICD-10-CM | POA: Diagnosis present

## 2013-06-28 DIAGNOSIS — N319 Neuromuscular dysfunction of bladder, unspecified: Secondary | ICD-10-CM | POA: Diagnosis present

## 2013-06-28 DIAGNOSIS — Z7401 Bed confinement status: Secondary | ICD-10-CM

## 2013-06-28 DIAGNOSIS — F079 Unspecified personality and behavioral disorder due to known physiological condition: Secondary | ICD-10-CM | POA: Diagnosis present

## 2013-06-28 DIAGNOSIS — Z79899 Other long term (current) drug therapy: Secondary | ICD-10-CM

## 2013-06-28 DIAGNOSIS — E871 Hypo-osmolality and hyponatremia: Secondary | ICD-10-CM | POA: Diagnosis present

## 2013-06-28 DIAGNOSIS — J690 Pneumonitis due to inhalation of food and vomit: Secondary | ICD-10-CM | POA: Diagnosis present

## 2013-06-28 DIAGNOSIS — J189 Pneumonia, unspecified organism: Secondary | ICD-10-CM

## 2013-06-28 HISTORY — DX: Multiple sclerosis: G35

## 2013-06-28 HISTORY — DX: Neuromuscular dysfunction of bladder, unspecified: N31.9

## 2013-06-28 HISTORY — DX: Pneumonia, unspecified organism: J18.9

## 2013-06-28 HISTORY — DX: Unspecified mental disorder due to known physiological condition: F09

## 2013-06-28 LAB — CBC WITH DIFFERENTIAL/PLATELET
Basophils Relative: 0 % (ref 0–1)
Eosinophils Absolute: 0 10*3/uL (ref 0.0–0.7)
Eosinophils Relative: 0 % (ref 0–5)
HCT: 37.6 % (ref 36.0–46.0)
Hemoglobin: 12.6 g/dL (ref 12.0–15.0)
Lymphs Abs: 2 10*3/uL (ref 0.7–4.0)
MCH: 30.2 pg (ref 26.0–34.0)
MCHC: 33.5 g/dL (ref 30.0–36.0)
MCV: 90.2 fL (ref 78.0–100.0)
Monocytes Absolute: 2.3 10*3/uL — ABNORMAL HIGH (ref 0.1–1.0)
Neutro Abs: 21.2 10*3/uL — ABNORMAL HIGH (ref 1.7–7.7)
Neutrophils Relative %: 83 % — ABNORMAL HIGH (ref 43–77)
Platelets: 196 10*3/uL (ref 150–400)
RBC: 4.17 MIL/uL (ref 3.87–5.11)

## 2013-06-28 LAB — URINE MICROSCOPIC-ADD ON

## 2013-06-28 LAB — URINALYSIS, ROUTINE W REFLEX MICROSCOPIC
Glucose, UA: NEGATIVE mg/dL
Nitrite: POSITIVE — AB
Specific Gravity, Urine: 1.029 (ref 1.005–1.030)
Urobilinogen, UA: 1 mg/dL (ref 0.0–1.0)
pH: 6 (ref 5.0–8.0)

## 2013-06-28 LAB — COMPREHENSIVE METABOLIC PANEL
AST: 27 U/L (ref 0–37)
BUN: 21 mg/dL (ref 6–23)
CO2: 19 mEq/L (ref 19–32)
Calcium: 10 mg/dL (ref 8.4–10.5)
Chloride: 111 mEq/L (ref 96–112)
Creatinine, Ser: 0.47 mg/dL — ABNORMAL LOW (ref 0.50–1.10)
GFR calc Af Amer: >90 mL/min (ref >90)
GFR calc non Af Amer: >90 mL/min (ref >90)
Glucose, Bld: 123 mg/dL — ABNORMAL HIGH (ref 70–99)
Total Protein: 9.3 g/dL — ABNORMAL HIGH (ref 6.0–8.3)

## 2013-06-28 MED ORDER — SODIUM CHLORIDE 0.9 % IV SOLN
1000.0000 mL | Freq: Once | INTRAVENOUS | Status: AC
  Administered 2013-06-28: 1000 mL via INTRAVENOUS

## 2013-06-28 MED ORDER — DEXTROSE 5 % IV SOLN
1.0000 g | Freq: Once | INTRAVENOUS | Status: AC
  Administered 2013-06-28: 1 g via INTRAVENOUS
  Filled 2013-06-28: qty 10

## 2013-06-28 MED ORDER — ENOXAPARIN SODIUM 40 MG/0.4ML ~~LOC~~ SOLN
40.0000 mg | SUBCUTANEOUS | Status: DC
  Administered 2013-06-29 – 2013-07-01 (×3): 40 mg via SUBCUTANEOUS
  Filled 2013-06-28 (×4): qty 0.4

## 2013-06-28 MED ORDER — IOHEXOL 300 MG/ML  SOLN
80.0000 mL | Freq: Once | INTRAMUSCULAR | Status: AC | PRN
  Administered 2013-06-28: 80 mL via INTRAVENOUS

## 2013-06-28 MED ORDER — IOHEXOL 300 MG/ML  SOLN: 25.0000 mL | INTRAMUSCULAR | Status: AC

## 2013-06-28 MED ORDER — DEXTROSE 5 % IV SOLN
500.0000 mg | INTRAVENOUS | Status: DC
  Administered 2013-06-29 – 2013-07-01 (×3): 500 mg via INTRAVENOUS
  Filled 2013-06-28 (×4): qty 500

## 2013-06-28 MED ORDER — SODIUM CHLORIDE 0.9 % IV SOLN: 1000.0000 mL | INTRAVENOUS | Status: DC

## 2013-06-28 MED ORDER — BACLOFEN 10 MG PO TABS
10.0000 mg | ORAL_TABLET | Freq: Three times a day (TID) | ORAL | Status: DC
  Administered 2013-06-29 – 2013-07-02 (×11): 10 mg via ORAL
  Filled 2013-06-28 (×13): qty 1

## 2013-06-28 MED ORDER — DEXTROSE 5 % IV SOLN
500.0000 mg | Freq: Once | INTRAVENOUS | Status: AC
  Administered 2013-06-28: 500 mg via INTRAVENOUS

## 2013-06-28 MED ORDER — SODIUM CHLORIDE 0.9 % IV SOLN
INTRAVENOUS | Status: DC
  Administered 2013-06-29: 01:00:00 via INTRAVENOUS

## 2013-06-28 MED ORDER — DEXTROSE 5 % IV SOLN: 1.0000 g | INTRAVENOUS | Status: DC

## 2013-06-28 NOTE — ED Notes (Signed)
PT PCP DR Elvina Sidle

## 2013-06-28 NOTE — ED Notes (Signed)
MD at bedside. 

## 2013-06-28 NOTE — ED Provider Notes (Signed)
CSN: [id number]     Arrival date & time 06/28/13  1406 History   None    Chief Complaint  Patient presents with  . Code Sepsis   (Consider location/radiation/quality/duration/timing/severity/associated sxs/prior Treatment) HPI [name] is a 52 y.o. y/o female w/ PMHx of Multiple Sclerosis, brought to the ED by family d/t fever and lethargy for the past 2-3 days. Patient accompanied by her sister as patient is generally non-verbal, and dependent on family for all ADL's. The sister claims she has noticed that she has been lethargic recently, w/ decreased appetite. Claims to have a mild cough recently, somewhat productive of yellowish sputum. Denies any recent aspiration events, claims she does not have any difficulty w/ swallowing. Claims that both her nephew and mother have been sick recently w/ cough. Sister denies any recent diarrhea, hematuria, no recent skin ulcerations, or signs of obvious pain. She took her temperature at home and claims it was 70 F. En route to the hospital, patient w/ temp of 103 F.   No past medical history on file. Past Surgical History  Procedure Laterality Date  . Colon surgery     No family history on file. History  Substance Use Topics  . Smoking status: Not on file  . Smokeless tobacco: Not on file  . Alcohol Use: No   OB History   Grav Para Term Preterm Abortions TAB SAB Ect Mult Living                 Review of Systems General: Positive for fever, fatigue, and decrease in appetite. Respiratory: Positive for cough. Denies SOB and wheezing.   Cardiovascular: Denies chest pain, and leg swelling.  Gastrointestinal: Denies nausea, vomiting, abdominal pain, diarrhea, constipation, blood in stool and abdominal distention.  Genitourinary: Denies dysuria, urgency, frequency, hematuria, flank pain and difficulty urinating.  Musculoskeletal: Denies myalgias, back pain, joint swelling, arthralgias. Skin: Denies pallor, rash and wounds.  Neurological:  Denies seizures.  Allergies  Review of patient's allergies indicates no known allergies.  Home Medications   Current Outpatient Rx  Name  Route  Sig  Dispense  Refill  . baclofen (LIORESAL) 10 MG tablet   Oral   Take 10 mg by mouth 3 (three) times daily.          Marland Kitchen DM-Phenylephrine-Acetaminophen (VICKS DAYQUIL COLD & FLU) 10-5-325 MG/15ML LIQD   Oral   Take 15 mLs by mouth daily as needed.         Marland Kitchen guaiFENesin (MUCINEX CHEST CONGESTION CHILD) 100 MG/5ML liquid   Oral   Take 400 mg by mouth daily as needed for cough.         Marland Kitchen tiZANidine (ZANAFLEX) 4 MG tablet   Oral   Take 4 mg by mouth 3 (three) times daily.          Physical Exam Filed Vitals:   06/28/13 1500 06/28/13 1515 06/28/13 1537 06/28/13 1616  BP: 152/100 139/91 137/92 113/82  Pulse: 112 110 105 96  Temp:    99 F (37.2 C)  TempSrc:    Rectal  Resp: 19 20 20 25   SpO2: 97% 97% 97% 97%   General: Vital signs reviewed.  Patient is a bed-bound female, in no acute distress and cooperative with exam. Appears lethargic, responsive to painful stimuli, somewhat to verbal stimuli.  Head: Normocephalic and atraumatic. Eyes: PERRL, EOMI, conjunctivae normal, No scleral icterus.  Neck: Supple, trachea midline, normal ROM, No JVD, masses, thyromegaly, or carotid bruit present.  Cardiovascular:  Tachycardic, regular rhythm, S1 normal, S2 normal, no murmurs, gallops, or rubs. Pulmonary/Chest: Normal respiratory effort, CTAB, no wheezes, rales, or rhonchi. Abdominal: Soft, tender to palpation, non-distended, bowel sounds are normal, no masses, organomegaly, or guarding present.  Musculoskeletal: No joint deformities, erythema, or stiffness, ROM full and no nontender.  Extremities: No swelling or edema,  pulses symmetric and intact bilaterally. No cyanosis or clubbing. Neurological: Unable to fully assess, patient bed-bound w/ minimal motor function. Skin: Feels febrile to . No rashes or erythema. No obvious ulcerations  or lesions. Psychiatric: Unable to assess, patient non-verbal 2/2 MS  ED Course  Procedures (including critical care time) Labs Review Labs Reviewed  COMPREHENSIVE METABOLIC PANEL - Abnormal; Notable for the following:    Sodium 151 (*)    Potassium 3.3 (*)    Glucose, Bld 123 (*)    Creatinine, Ser 0.47 (*)    Total Protein 9.3 (*)    Total Bilirubin 1.4 (*)    All other components within normal limits  URINALYSIS, ROUTINE W REFLEX MICROSCOPIC - Abnormal; Notable for the following:    Color, Urine AMBER (*)    APPearance CLOUDY (*)    Hgb urine dipstick MODERATE (*)    Bilirubin Urine SMALL (*)    Ketones, ur 40 (*)    Protein, ur 100 (*)    Nitrite POSITIVE (*)    Leukocytes, UA TRACE (*)    All other components within normal limits  URINE MICROSCOPIC-ADD ON - Abnormal; Notable for the following:    Bacteria, UA MANY (*)    All other components within normal limits  CULTURE, BLOOD (ROUTINE X 2)  CULTURE, BLOOD (ROUTINE X 2)  URINE CULTURE  CBC WITH DIFFERENTIAL  CG4 I-STAT (LACTIC ACID)   Imaging Review Dg Chest Port 1 View  06/28/2013   CLINICAL DATA:  Code sepsis  EXAM: PORTABLE CHEST - 1 VIEW  COMPARISON:  None.  FINDINGS: The heart size and mediastinal contours are within normal limits. Lungs are hyperinflated and there are coarsened interstitial markings noted bilaterally. No airspace consolidation. The visualized skeletal structures are unremarkable.  IMPRESSION: 1. No acute cardiopulmonary abnormalities.   Electronically Signed   By: Signa Kell M.D.   On: 06/28/2013 14:57   EKG Interpretation   None      MDM   [name] is a 52 y.o. y/o female w/ PMHx of Multiple Sclerosis, brought to the ED by family d/t fever and lethargy for the past 2-3 days. Patient to be admitted as code sepsis. On examination of the patient, lungs sound clear to auscultation. Patient does exhibit pain response on palpation of the abdomen. Suspect pyelonephritis at this time.  Family denies any recent history of diarrhea, hematuria or obvious signs of dysuria. Patient w/ tachycardia on exam and febrile to 102. UA shows 40 ketones, 100 protein, +ve for nitrites, trace leukocytes, moderate Hb, -2 WBC's, 3-6 RBC's, many bacteria.  -CBC shows leukocytosis of 25.5 -Started on Rocephin 1g IV + Azithromycin 500 mg IV once -Blood cultures pending -Lactic acid 2.16, wnl -CXR performed, shows no signs of active cardiopulmonary disease.  -Given 2L NS bolus -CT abdomen/pelvis ordered  Will admit to family medicine service for further evaluation and treatment of sepsis.        Courtney Paris, MD 06/28/13 2022

## 2013-06-28 NOTE — ED Provider Notes (Signed)
I saw and evaluated the patient, reviewed the resident's note and I agree with the findings and plan.  EKG Interpretation    Date/Time:    Ventricular Rate:    PR Interval:    QRS Duration:   QT Interval:    QTC Calculation:   R Axis:     Text Interpretation:              52 year old female with a history of cerebral palsy presenting with fever and lethargy. Code sepsis. Empiric Rocephin and azithromycin given shortly after arrival .On exam no distress, minimally interactive, normal respirations, abdomen soft, nondistended, mildly tender. Workup reveals urinary tract infection. Plan to admit for treatment of pyelonephritis.  ED course prolonged secondary to consultant's request for CT scan prior to admission.  Clinical Impression: 1. Sepsis   2. CAP (community acquired pneumonia)   3. Pyelonephritis       Candyce Churn, MD 06/29/13 [phone]

## 2013-06-28 NOTE — H&P (Signed)
[facility] History and Physical  Patient: Roberta Carpenter  ZOX:[id number]  DOB: [birth year]  DOS: the patient was seen and examined on 06/28/2013 PCP: Provider Not In System  Chief Complaint: Abdominal pain  HPI: Roberta Carpenter is a 52 y.o. female with Past medical history of multiple sclerosis, ADL dependent. The patient is coming from home. The patient appears poor historian due to her MS. The history has been primarily obtained from Epic. The patient denies any complaint of fever, chest pain, shortness of breath, headache, nausea, vomiting, diarrhea leg pain. On my evaluation the patient also denied any complaint of abdominal pain. She is a presented in the ED with fever tachycardia. The family mention to the ED physician that she has been more lethargic with decreased appetite and has a cough with yellowish expectoration. Today when she took her temperature home it was 101 Fahrenheit.  Review of Systems: as mentioned in the history of present illness.  A Comprehensive review of the other systems is negative.  History reviewed. No pertinent past medical history. Past Surgical History  Procedure Laterality Date  . Colon surgery     Social History:  reports that she does not drink alcohol or use illicit drugs. Her tobacco history is not on file. Her Independent for most of her  ADL.  No Known Allergies  No family history on file.  Prior to Admission medications   Medication Sig Start Date End Date Taking? Authorizing Provider  baclofen (LIORESAL) 10 MG tablet Take 10 mg by mouth 3 (three) times daily.    Yes Historical Provider, MD  DM-Phenylephrine-Acetaminophen (VICKS DAYQUIL COLD & FLU) 10-5-325 MG/15ML LIQD Take 15 mLs by mouth daily as needed.   Yes Historical Provider, MD  guaiFENesin (MUCINEX CHEST CONGESTION CHILD) 100 MG/5ML liquid Take 400 mg by mouth daily as needed for cough.   Yes Historical Provider, MD  tiZANidine (ZANAFLEX) 4 MG tablet Take 4 mg by mouth 3  (three) times daily.   Yes Historical Provider, MD    Physical Exam: Filed Vitals:   06/28/13 1945 06/28/13 2015 06/28/13 2045 06/28/13 2115  BP: 110/79 117/79 119/75 116/79  Pulse: 102 103 99 98  Temp:      TempSrc:      Resp: 16 20 17 19   SpO2: 96% 98% 99% 97%    General: Alert, Awake and Oriented to Place and Person. Appear in mild distress Eyes: PERRL ENT: Oral Mucosa clear dry. Neck: No JVD Cardiovascular: S1 and S2 Present, no Murmur, Peripheral Pulses Present Respiratory: Bilateral Air entry equal and Decreased, bilateral basal Crackles, no wheezes Abdomen: Bowel Sound Present, Soft and Non tender Skin: No Rash Extremities: Trace Pedal edema, no calf tenderness Neurologic: Grossly Unremarkable.  Labs on Admission:  CBC:  Recent Labs Lab 06/28/13 1457  WBC 25.5*  NEUTROABS 21.2*  HGB 12.6  HCT 37.6  MCV 90.2  PLT 196    CMP     Component Value Date/Time   NA 151* 06/28/2013 1427   K 3.3* 06/28/2013 1427   CL 111 06/28/2013 1427   CO2 19 06/28/2013 1427   GLUCOSE 123* 06/28/2013 1427   BUN 21 06/28/2013 1427   CREATININE 0.47* 06/28/2013 1427   CALCIUM 10.0 06/28/2013 1427   PROT 9.3* 06/28/2013 1427   ALBUMIN 3.9 06/28/2013 1427   AST 27 06/28/2013 1427   ALT 27 06/28/2013 1427   ALKPHOS 100 06/28/2013 1427   BILITOT 1.4* 06/28/2013 1427   GFRNONAA >90 06/28/2013 1427   GFRAA >  90 06/28/2013 1427    No results found for this basename: LIPASE, AMYLASE,  in the last 168 hours No results found for this basename: AMMONIA,  in the last 168 hours  No results found for this basename: CKTOTAL, CKMB, CKMBINDEX, TROPONINI,  in the last 168 hours BNP (last 3 results) No results found for this basename: PROBNP,  in the last 8760 hours  Radiological Exams on Admission: Ct Abdomen Pelvis W Contrast  06/28/2013   CLINICAL DATA:  Code sepsis, fever  EXAM: CT ABDOMEN AND PELVIS WITH CONTRAST  TECHNIQUE: Multidetector CT imaging of the abdomen and pelvis was performed using  the standard protocol following bolus administration of intravenous contrast.  CONTRAST:  80mL OMNIPAQUE IOHEXOL 300 MG/ML  SOLN  COMPARISON:  Prior CT abdomen/pelvis 01/04/2011  FINDINGS: Lower Chest: Patchy ground-glass attenuation airspace disease and early consolidative changes in the dependent right lower lobe. Visualized cardiac structures within normal limits for size. No pericardial effusion. Unremarkable distal thoracic esophagus.  Abdomen: Unremarkable CT appearance of the stomach, duodenum, spleen, adrenal glands and pancreas. Normal hepatic contour and morphology. Gallbladder is unremarkable. No intra or extrahepatic biliary ductal dilatation.  Unremarkable appearance of the bilateral kidneys. No focal solid lesion, hydronephrosis or nephrolithiasis. Simple cyst in the lower pole of the left kidney.  No evidence of obstruction or focal bowel wall thickening. Normal appendix in the right lower quadrant. The terminal ileum is unremarkable. Large rectal stool ball. Colonic diverticular disease without CT evidence of active inflammation. No free fluid.  Pelvis: Unremarkable appearance of the bladder. Air and fluid noted within the upper vagina. Minimally complex low-attenuation right ovarian cyst versus 2 smaller adjacent cysts. It measured is a single cyst, the maximal diameter is 3.6 cm. If measured individually, the larger cyst measures 2.8 cm. No definite free fluid or suspicious adenopathy.  Bones/Soft Tissues: No acute fracture or aggressive appearing lytic or blastic osseous lesion.  Vascular: No significant atherosclerotic vascular disease, aneurysmal dilatation or acute abnormality.  IMPRESSION: 1. Patchy ground-glass attenuation opacity and early consolidative changes in the dependent right lower lobe concerning for small volume aspiration versus early bronchopneumonia. 2. Large rectal stool ball concerning for constipation/impaction. 3. Air and fluid noted within the upper vagina. This is of  uncertain etiology and clinical significance. 4. Minimally complex versus adjacent simple right ovarian cysts. If the patient is not menopausal, this is almost certainly benign and requires no follow-up.   Electronically Signed   By: Malachy Moan M.D.   On: 06/28/2013 21:07   Dg Chest Port 1 View  06/28/2013   CLINICAL DATA:  Code sepsis  EXAM: PORTABLE CHEST - 1 VIEW  COMPARISON:  None.  FINDINGS: The heart size and mediastinal contours are within normal limits. Lungs are hyperinflated and there are coarsened interstitial markings noted bilaterally. No airspace consolidation. The visualized skeletal structures are unremarkable.  IMPRESSION: 1. No acute cardiopulmonary abnormalities.   Electronically Signed   By: Signa Kell M.D.   On: 06/28/2013 14:57    Assessment/Plan Principal Problem:   CAP (community acquired pneumonia) Active Problems:   Multiple sclerosis   1. CAP (community acquired pneumonia) and sepsis The patient presented with the complaints of abdominal pain. She underwent a CT scan of the abdomen which shows early consolidation changes in the right lung and the rectum with impacted stool. She has leukocytosis with UTI. She was tachycardic and hypertensive on arrival with fever thus she needs the criteria for sepsis. At present she will be treated with  IV ceftriaxone and IV azithromycin. Her heart rate and blood pressure has improved significantly with IV fluids. I would continue to hydrate her. She will be kept n.p.o. and a speech evaluation will be done in the morning to rule out aspiration.  2. Multiple sclerosis Patient and provided bedside assistance as well as physical therapy in the morning  3. Hypernatremia There is a possibility that the patient is significantly dehydrated due to presence of severe constipation and hypernatremia. I would hydrate her with IV fluids and obtain a BMP and we'll monitor her sodium levels.  DVT Prophylaxis: subcutaneous  Heparin Nutrition: N.p.o.  Code Status: Presumed full  Disposition: Admitted to inpatient in telemetry unit.  Author: Lynden Oxford, MD [facility] Pager: [phone] 06/28/2013, 11:07 PM    If 7PM-7AM, please contact night-coverage [email or web address] Password [email or web address]

## 2013-06-28 NOTE — ED Notes (Signed)
Pt not drinking oral contrast well, only sips and not even 1/4 dose in.  Physician wants to do CT with IV contrast only.  CT notified.

## 2013-06-28 NOTE — ED Notes (Signed)
Family not in room, unclear if they have left.

## 2013-06-28 NOTE — ED Notes (Signed)
Pt responds appropriately verbally when asked a question, either head shaking or a couple words.  Denies pain.  Is cold, warm blankets renewed.

## 2013-06-28 NOTE — ED Provider Notes (Signed)
Pt signed out to me at shift change. Pt with fever, tachycardia, abdominal pain on presentation. Workup showed UTI, elevated WBC. Pt was resucitated with IV fluids and zithromax and rocephin was given for infection. Her VS improved and are now normal. [facility] was consulted for admission but asked for a CT abd/pelvis which is what is pending now.   Results for orders placed during the hospital encounter of 06/28/13  CBC WITH DIFFERENTIAL      Result Value Range   WBC 25.5 (*) 4.0 - 10.5 K/uL   RBC 4.17  3.87 - 5.11 MIL/uL   Hemoglobin 12.6  12.0 - 15.0 g/dL   HCT 16.1  09.6 - 04.5 %   MCV 90.2  78.0 - 100.0 fL   MCH 30.2  26.0 - 34.0 pg   MCHC 33.5  30.0 - 36.0 g/dL   RDW 40.9  81.1 - 91.4 %   Platelets 196  150 - 400 K/uL   Neutrophils Relative % 83 (*) 43 - 77 %   Lymphocytes Relative 8 (*) 12 - 46 %   Monocytes Relative 9  3 - 12 %   Eosinophils Relative 0  0 - 5 %   Basophils Relative 0  0 - 1 %   Neutro Abs 21.2 (*) 1.7 - 7.7 K/uL   Lymphs Abs 2.0  0.7 - 4.0 K/uL   Monocytes Absolute 2.3 (*) 0.1 - 1.0 K/uL   Eosinophils Absolute 0.0  0.0 - 0.7 K/uL   Basophils Absolute 0.0  0.0 - 0.1 K/uL   Smear Review MORPHOLOGY UNREMARKABLE    COMPREHENSIVE METABOLIC PANEL      Result Value Range   Sodium 151 (*) 135 - 145 mEq/L   Potassium 3.3 (*) 3.5 - 5.1 mEq/L   Chloride 111  96 - 112 mEq/L   CO2 19  19 - 32 mEq/L   Glucose, Bld 123 (*) 70 - 99 mg/dL   BUN 21  6 - 23 mg/dL   Creatinine, Ser 7.82 (*) 0.50 - 1.10 mg/dL   Calcium 95.6  8.4 - 21.3 mg/dL   Total Protein 9.3 (*) 6.0 - 8.3 g/dL   Albumin 3.9  3.5 - 5.2 g/dL   AST 27  0 - 37 U/L   ALT 27  0 - 35 U/L   Alkaline Phosphatase 100  39 - 117 U/L   Total Bilirubin 1.4 (*) 0.3 - 1.2 mg/dL   GFR calc non Af Amer >90  >90 mL/min   GFR calc Af Amer >90  >90 mL/min  URINALYSIS, ROUTINE W REFLEX MICROSCOPIC      Result Value Range   Color, Urine AMBER (*) YELLOW   APPearance CLOUDY (*) CLEAR   Specific Gravity, Urine 1.029  1.005 -  1.030   pH 6.0  5.0 - 8.0   Glucose, UA NEGATIVE  NEGATIVE mg/dL   Hgb urine dipstick MODERATE (*) NEGATIVE   Bilirubin Urine SMALL (*) NEGATIVE   Ketones, ur 40 (*) NEGATIVE mg/dL   Protein, ur 086 (*) NEGATIVE mg/dL   Urobilinogen, UA 1.0  0.0 - 1.0 mg/dL   Nitrite POSITIVE (*) NEGATIVE   Leukocytes, UA TRACE (*) NEGATIVE  URINE MICROSCOPIC-ADD ON      Result Value Range   Squamous Epithelial / LPF RARE  RARE   WBC, UA 0-2  <3 WBC/hpf   RBC / HPF 3-6  <3 RBC/hpf   Bacteria, UA MANY (*) RARE  CG4 I-STAT (LACTIC ACID)  Result Value Range   Lactic Acid, Venous 2.16  0.5 - 2.2 mmol/L   Ct Abdomen Pelvis W Contrast  06/28/2013   CLINICAL DATA:  Code sepsis, fever  EXAM: CT ABDOMEN AND PELVIS WITH CONTRAST  TECHNIQUE: Multidetector CT imaging of the abdomen and pelvis was performed using the standard protocol following bolus administration of intravenous contrast.  CONTRAST:  80mL OMNIPAQUE IOHEXOL 300 MG/ML  SOLN  COMPARISON:  Prior CT abdomen/pelvis 01/04/2011  FINDINGS: Lower Chest: Patchy ground-glass attenuation airspace disease and early consolidative changes in the dependent right lower lobe. Visualized cardiac structures within normal limits for size. No pericardial effusion. Unremarkable distal thoracic esophagus.  Abdomen: Unremarkable CT appearance of the stomach, duodenum, spleen, adrenal glands and pancreas. Normal hepatic contour and morphology. Gallbladder is unremarkable. No intra or extrahepatic biliary ductal dilatation.  Unremarkable appearance of the bilateral kidneys. No focal solid lesion, hydronephrosis or nephrolithiasis. Simple cyst in the lower pole of the left kidney.  No evidence of obstruction or focal bowel wall thickening. Normal appendix in the right lower quadrant. The terminal ileum is unremarkable. Large rectal stool ball. Colonic diverticular disease without CT evidence of active inflammation. No free fluid.  Pelvis: Unremarkable appearance of the bladder. Air  and fluid noted within the upper vagina. Minimally complex low-attenuation right ovarian cyst versus 2 smaller adjacent cysts. It measured is a single cyst, the maximal diameter is 3.6 cm. If measured individually, the larger cyst measures 2.8 cm. No definite free fluid or suspicious adenopathy.  Bones/Soft Tissues: No acute fracture or aggressive appearing lytic or blastic osseous lesion.  Vascular: No significant atherosclerotic vascular disease, aneurysmal dilatation or acute abnormality.  IMPRESSION: 1. Patchy ground-glass attenuation opacity and early consolidative changes in the dependent right lower lobe concerning for small volume aspiration versus early bronchopneumonia. 2. Large rectal stool ball concerning for constipation/impaction. 3. Air and fluid noted within the upper vagina. This is of uncertain etiology and clinical significance. 4. Minimally complex versus adjacent simple right ovarian cysts. If the patient is not menopausal, this is almost certainly benign and requires no follow-up.   Electronically Signed   By: Malachy Moan M.D.   On: 06/28/2013 21:07   Dg Chest Port 1 View  06/28/2013   CLINICAL DATA:  Code sepsis  EXAM: PORTABLE CHEST - 1 VIEW  COMPARISON:  None.  FINDINGS: The heart size and mediastinal contours are within normal limits. Lungs are hyperinflated and there are coarsened interstitial markings noted bilaterally. No airspace consolidation. The visualized skeletal structures are unremarkable.  IMPRESSION: 1. No acute cardiopulmonary abnormalities.   Electronically Signed   By: Signa Kell M.D.   On: 06/28/2013 14:57    9:42 PM CT as above. Possible pneumonia. Pt received appropriate antibiotics. VS continue to be normal. [facility] called for admission.   Lottie Mussel, PA-C 06/28/13 2143

## 2013-06-28 NOTE — ED Notes (Signed)
Patient transported to CT 

## 2013-06-28 NOTE — ED Notes (Signed)
Report given to Michelle RN on 2W. 

## 2013-06-29 ENCOUNTER — Encounter (HOSPITAL_COMMUNITY): Payer: Self-pay | Admitting: General Practice

## 2013-06-29 DIAGNOSIS — A419 Sepsis, unspecified organism: Principal | ICD-10-CM

## 2013-06-29 DIAGNOSIS — G35 Multiple sclerosis: Secondary | ICD-10-CM | POA: Diagnosis present

## 2013-06-29 LAB — COMPREHENSIVE METABOLIC PANEL
ALT: 19 U/L (ref 0–35)
AST: 20 U/L (ref 0–37)
Albumin: 2.9 g/dL — ABNORMAL LOW (ref 3.5–5.2)
Alkaline Phosphatase: 78 U/L (ref 39–117)
BUN: 15 mg/dL (ref 6–23)
CO2: 20 mEq/L (ref 19–32)
Calcium: 8.8 mg/dL (ref 8.4–10.5)
GFR calc Af Amer: >90 mL/min (ref >90)
GFR calc non Af Amer: >90 mL/min (ref >90)
Glucose, Bld: 91 mg/dL (ref 70–99)
Potassium: 2.8 mEq/L — ABNORMAL LOW (ref 3.5–5.1)
Total Bilirubin: 1.2 mg/dL (ref 0.3–1.2)
Total Protein: 7.2 g/dL (ref 6.0–8.3)

## 2013-06-29 LAB — CBC WITH DIFFERENTIAL/PLATELET
Basophils Absolute: 0 10*3/uL (ref 0.0–0.1)
Basophils Relative: 0 % (ref 0–1)
HCT: 36.3 % (ref 36.0–46.0)
Lymphocytes Relative: 11 % — ABNORMAL LOW (ref 12–46)
Lymphs Abs: 2.5 10*3/uL (ref 0.7–4.0)
MCHC: 32.8 g/dL (ref 30.0–36.0)
MCV: 90.1 fL (ref 78.0–100.0)
Monocytes Relative: 8 % (ref 3–12)
Neutro Abs: 18.1 10*3/uL — ABNORMAL HIGH (ref 1.7–7.7)
Neutrophils Relative %: 81 % — ABNORMAL HIGH (ref 43–77)
Platelets: 181 10*3/uL (ref 150–400)
RDW: 15.4 % (ref 11.5–15.5)
WBC: 22.4 10*3/uL — ABNORMAL HIGH (ref 4.0–10.5)

## 2013-06-29 LAB — BASIC METABOLIC PANEL
BUN: 16 mg/dL (ref 6–23)
CO2: 21 mEq/L (ref 19–32)
Calcium: 8.7 mg/dL (ref 8.4–10.5)
GFR calc Af Amer: >90 mL/min (ref >90)
GFR calc non Af Amer: >90 mL/min (ref >90)
Glucose, Bld: 106 mg/dL — ABNORMAL HIGH (ref 70–99)
Sodium: 149 mEq/L — ABNORMAL HIGH (ref 135–145)

## 2013-06-29 LAB — MAGNESIUM: Magnesium: 1.8 mg/dL (ref 1.5–2.5)

## 2013-06-29 MED ORDER — POTASSIUM CHLORIDE 20 MEQ/15ML (10%) PO LIQD
40.0000 meq | Freq: Two times a day (BID) | ORAL | Status: AC
  Administered 2013-06-29 (×2): 40 meq via ORAL
  Filled 2013-06-29 (×2): qty 30

## 2013-06-29 MED ORDER — POTASSIUM CHLORIDE 10 MEQ/100ML IV SOLN
10.0000 meq | INTRAVENOUS | Status: AC
  Administered 2013-06-29 (×5): 10 meq via INTRAVENOUS
  Filled 2013-06-29 (×5): qty 100

## 2013-06-29 MED ORDER — SODIUM CHLORIDE 0.9 % IV SOLN: INTRAVENOUS | Status: AC

## 2013-06-29 MED ORDER — BISACODYL 10 MG RE SUPP
10.0000 mg | Freq: Every day | RECTAL | Status: DC
  Administered 2013-06-29: 10 mg via RECTAL
  Filled 2013-06-29: qty 1

## 2013-06-29 MED ORDER — VANCOMYCIN HCL IN DEXTROSE 1-5 GM/200ML-% IV SOLN
1000.0000 mg | Freq: Three times a day (TID) | INTRAVENOUS | Status: DC
  Administered 2013-06-29 – 2013-06-30 (×3): 1000 mg via INTRAVENOUS
  Filled 2013-06-29 (×4): qty 200

## 2013-06-29 MED ORDER — POLYETHYLENE GLYCOL 3350 17 G PO PACK
17.0000 g | PACK | Freq: Two times a day (BID) | ORAL | Status: DC
  Administered 2013-06-29 – 2013-07-01 (×5): 17 g via ORAL
  Filled 2013-06-29 (×8): qty 1

## 2013-06-29 MED ORDER — PIPERACILLIN-TAZOBACTAM 3.375 G IVPB
3.3750 g | Freq: Three times a day (TID) | INTRAVENOUS | Status: DC
  Administered 2013-06-29 – 2013-07-02 (×10): 3.375 g via INTRAVENOUS
  Filled 2013-06-29 (×12): qty 50

## 2013-06-29 NOTE — Progress Notes (Signed)
UR Completed.  Moksha Dorgan Jane [phone] 06/29/2013  

## 2013-06-29 NOTE — ED Provider Notes (Signed)
I saw and evaluated the patient, reviewed the resident's note and I agree with the findings and plan.  EKG Interpretation    Date/Time:    Ventricular Rate:    PR Interval:    QRS Duration:   QT Interval:    QTC Calculation:   R Axis:     Text Interpretation:                Candyce Churn, MD 06/29/13 [phone]

## 2013-06-29 NOTE — Progress Notes (Signed)
[facility]                                                                                Patient Demographics  Roberta Carpenter, is a 52 y.o. female, DOB - April 11, 1961, ZOX:[id number]  Admit date - 06/28/2013   Admitting Physician Lynden Oxford, MD  Outpatient Primary MD for the patient is Provider Not In System  LOS - 1   Chief Complaint  Patient presents with  . Code Sepsis        Assessment & Plan    1. CAP (community acquired pneumonia) and sepsis   responding well to empiric antibiotics, speech evaluation done, aspiration precautions, nebulizer treatments and oxygen as needed.     2. Multiple sclerosis with chronic severe paraparesis she is essentially bed bound at baseline Patient and provided bedside assistance, patient followup with neurologist post discharge     3. Hypernatremia and Hypokalemia  There is a possibility that the patient is significantly dehydrated due to presence of severe constipation and hypernatremia. Obtain urine electrolytes, IV fluids with normal saline, sodium improving, potassium replaced. We will monitor her sodium and potassium levels.     4. Constipation and stool impaction. Will be disimpacted, bowel regimen initiated.     Code Status: Full  Family Communication: none present  Disposition Plan: Home   Procedures CT Abd   Consults  Speech   Medications  Scheduled Meds: . azithromycin  500 mg Intravenous Q24H  . baclofen  10 mg Oral TID  . enoxaparin (LOVENOX) injection  40 mg Subcutaneous Q24H  . piperacillin-tazobactam (ZOSYN)  IV  3.375 g Intravenous Q8H  . potassium chloride  10 mEq Intravenous Q1 Hr x 5  . potassium chloride  40 mEq Oral BID   Continuous Infusions: . sodium chloride 100 mL/hr at 06/29/13 0715   PRN Meds:.  DVT Prophylaxis  Lovenox   Lab Results  Component Value Date   PLT 181 06/29/2013    Antibiotics    Anti-infectives   Start     Dose/Rate Route Frequency Ordered Stop    06/29/13 1600  azithromycin (ZITHROMAX) 500 mg in dextrose 5 % 250 mL IVPB     500 mg 250 mL/hr over 60 Minutes Intravenous Every 24 hours 06/28/13 2327 07/06/13 1559   06/29/13 1500  cefTRIAXone (ROCEPHIN) 1 g in dextrose 5 % 50 mL IVPB  Status:  Discontinued     1 g 100 mL/hr over 30 Minutes Intravenous Every 24 hours 06/28/13 2327 06/29/13 0712   06/29/13 0730  piperacillin-tazobactam (ZOSYN) IVPB 3.375 g     3.375 g 12.5 mL/hr over 240 Minutes Intravenous 3 times per day 06/29/13 0716     06/28/13 1515  cefTRIAXone (ROCEPHIN) 1 g in dextrose 5 % 50 mL IVPB     1 g 100 mL/hr over 30 Minutes Intravenous  Once 06/28/13 1502 06/28/13 1549   06/28/13 1515  azithromycin (ZITHROMAX) 500 mg in dextrose 5 % 250 mL IVPB     500 mg 250 mL/hr over 60 Minutes Intravenous  Once 06/28/13 1502 06/28/13 1700          Subjective:   Roberta Carpenter today has, No headache, No chest  pain, No abdominal pain - No Nausea, No new weakness tingling or numbness, No Cough - SOB.    Objective:   Filed Vitals:   06/28/13 2115 06/28/13 2326 06/29/13 0439 06/29/13 1114  BP: 116/79 104/70 103/66   Pulse: 98 100 79   Temp:  99 F (37.2 C) 98.2 F (36.8 C)   TempSrc:  Oral Oral   Resp: 19 18 18    Height:    5' 4.17" (1.63 m)  Weight:  99.5 kg (219 lb 5.7 oz)  99.5 kg (219 lb 5.7 oz)  SpO2: 97% 97% 100%     Wt Readings from Last 3 Encounters:  06/29/13 99.5 kg (219 lb 5.7 oz)  01/19/11 54.795 kg (120 lb 12.8 oz)    No intake or output data in the 24 hours ending 06/29/13 1201  Exam Awake Alert, Oriented X 3, No new F.N deficits, Normal affect, chronic paraparesis from MS [place].AT,PERRAL Supple Neck,No JVD, No cervical lymphadenopathy appriciated.  Symmetrical Chest wall movement, Good air movement bilaterally, CTAB RRR,No Gallops,Rubs or new Murmurs, No Parasternal Heave +ve B.Sounds, Abd Soft, Non tender, No organomegaly appriciated, No rebound - guarding or rigidity. No Cyanosis, Clubbing or edema,  No new Rash or bruise      Data Review   Micro Results Recent Results (from the past 240 hour(s))  CULTURE, BLOOD (ROUTINE X 2)     Status: None   Collection Time    06/28/13  2:30 PM      Result Value Range Status   Specimen Description BLOOD RIGHT FOREARM   Final   Special Requests     Final   Value: BOTTLES DRAWN AEROBIC AND ANAEROBIC 10CCBLUE 5CCRED   Culture  Setup Time     Final   Value: 06/28/2013 21:27     Performed at [facility]   Culture     Final   Value:        BLOOD CULTURE RECEIVED NO GROWTH TO DATE CULTURE WILL BE HELD FOR 5 DAYS BEFORE ISSUING A FINAL NEGATIVE REPORT     Performed at [facility]   Report Status PENDING   Incomplete    Radiology Reports Ct Abdomen Pelvis W Contrast  06/28/2013   CLINICAL DATA:  Code sepsis, fever  EXAM: CT ABDOMEN AND PELVIS WITH CONTRAST  TECHNIQUE: Multidetector CT imaging of the abdomen and pelvis was performed using the standard protocol following bolus administration of intravenous contrast.  CONTRAST:  80mL OMNIPAQUE IOHEXOL 300 MG/ML  SOLN  COMPARISON:  Prior CT abdomen/pelvis 01/04/2011  FINDINGS: Lower Chest: Patchy ground-glass attenuation airspace disease and early consolidative changes in the dependent right lower lobe. Visualized cardiac structures within normal limits for size. No pericardial effusion. Unremarkable distal thoracic esophagus.  Abdomen: Unremarkable CT appearance of the stomach, duodenum, spleen, adrenal glands and pancreas. Normal hepatic contour and morphology. Gallbladder is unremarkable. No intra or extrahepatic biliary ductal dilatation.  Unremarkable appearance of the bilateral kidneys. No focal solid lesion, hydronephrosis or nephrolithiasis. Simple cyst in the lower pole of the left kidney.  No evidence of obstruction or focal bowel wall thickening. Normal appendix in the right lower quadrant. The terminal ileum is unremarkable. Large rectal stool ball. Colonic diverticular disease  without CT evidence of active inflammation. No free fluid.  Pelvis: Unremarkable appearance of the bladder. Air and fluid noted within the upper vagina. Minimally complex low-attenuation right ovarian cyst versus 2 smaller adjacent cysts. It measured is a single cyst, the maximal diameter is 3.6  cm. If measured individually, the larger cyst measures 2.8 cm. No definite free fluid or suspicious adenopathy.  Bones/Soft Tissues: No acute fracture or aggressive appearing lytic or blastic osseous lesion.  Vascular: No significant atherosclerotic vascular disease, aneurysmal dilatation or acute abnormality.  IMPRESSION: 1. Patchy ground-glass attenuation opacity and early consolidative changes in the dependent right lower lobe concerning for small volume aspiration versus early bronchopneumonia. 2. Large rectal stool ball concerning for constipation/impaction. 3. Air and fluid noted within the upper vagina. This is of uncertain etiology and clinical significance. 4. Minimally complex versus adjacent simple right ovarian cysts. If the patient is not menopausal, this is almost certainly benign and requires no follow-up.   Electronically Signed   By: Malachy Moan M.D.   On: 06/28/2013 21:07   Dg Chest Port 1 View  06/28/2013   CLINICAL DATA:  Code sepsis  EXAM: PORTABLE CHEST - 1 VIEW  COMPARISON:  None.  FINDINGS: The heart size and mediastinal contours are within normal limits. Lungs are hyperinflated and there are coarsened interstitial markings noted bilaterally. No airspace consolidation. The visualized skeletal structures are unremarkable.  IMPRESSION: 1. No acute cardiopulmonary abnormalities.   Electronically Signed   By: Signa Kell M.D.   On: 06/28/2013 14:57    CBC  Recent Labs Lab 06/28/13 1457 06/29/13 0406  WBC 25.5* 22.4*  HGB 12.6 11.9*  HCT 37.6 36.3  PLT 196 181  MCV 90.2 90.1  MCH 30.2 29.5  MCHC 33.5 32.8  RDW 15.5 15.4  LYMPHSABS 2.0 2.5  MONOABS 2.3* 1.7*  EOSABS 0.0 0.1   BASOSABS 0.0 0.0    Chemistries   Recent Labs Lab 06/28/13 1427 06/28/13 2340 06/29/13 0406  NA 151* 149* 148*  K 3.3* 2.8* 2.8*  CL 111 115* 114*  CO2 19 21 20   GLUCOSE 123* 106* 91  BUN 21 16 15   CREATININE 0.47* 0.43* 0.34*  CALCIUM 10.0 8.7 8.8  AST 27  --  20  ALT 27  --  19  ALKPHOS 100  --  78  BILITOT 1.4*  --  1.2   ------------------------------------------------------------------------------------------------------------------ estimated creatinine clearance is 96.8 ml/min (by C-G formula based on Cr of 0.34). ------------------------------------------------------------------------------------------------------------------ No results found for this basename: HGBA1C,  in the last 72 hours ------------------------------------------------------------------------------------------------------------------ No results found for this basename: CHOL, HDL, LDLCALC, TRIG, CHOLHDL, LDLDIRECT,  in the last 72 hours ------------------------------------------------------------------------------------------------------------------ No results found for this basename: TSH, T4TOTAL, FREET3, T3FREE, THYROIDAB,  in the last 72 hours ------------------------------------------------------------------------------------------------------------------ No results found for this basename: VITAMINB12, FOLATE, FERRITIN, TIBC, IRON, RETICCTPCT,  in the last 72 hours  Coagulation profile No results found for this basename: INR, PROTIME,  in the last 168 hours  No results found for this basename: DDIMER,  in the last 72 hours  Cardiac Enzymes No results found for this basename: CK, CKMB, TROPONINI, MYOGLOBIN,  in the last 168 hours ------------------------------------------------------------------------------------------------------------------ No components found with this basename: POCBNP,      Time Spent in minutes  35   SINGH,PRASHANT K M.D on 06/29/2013 at 12:01 PM  Between 7am to  7pm - Pager - [phone]  After 7pm go to [email or web address] - password [email or web address]  And look for the night coverage person covering for me after hours  [facility] Office  [phone]

## 2013-06-29 NOTE — Progress Notes (Signed)
Administered suppository to pt, and pt immediately passed a very large amount of solid stool.

## 2013-06-29 NOTE — Evaluation (Signed)
Clinical/Bedside Swallow Evaluation Patient Details  Name: Roberta Carpenter MRN: [id number] Date of Birth: 1961-05-14  Today's Date: 06/29/2013 Time: 1010-1040 SLP Time Calculation (min): 30 min  Past Medical History:  Past Medical History  Diagnosis Date  . MS (multiple sclerosis)     [name] 05/21/2000 (06/29/2013)  . Neurogenic bladder     [name] 05/21/2000 (06/29/2013)  . 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 CAP (community acquired pneumonia)     [name] 06/28/2013 (06/29/2013)   Past Surgical History:  Past Surgical History  Procedure Laterality Date  . Colon surgery    . Appendectomy    . Knee arthroscopy Right     [name] 05/21/2000 (06/29/2013)  . Abdominal exploration surgery  04/2009    w/LOA/notes 05/19/2009 06/29/2013)  . Abdominal hysterectomy  04/2009    supracervical/notes 05/19/2009 (06/29/2013)  . Exploratory laparotomy  12/2010    w/LOA/notes 01/04/2011 (06/29/2013)  . Exploratory laparotomy  12/2010    w/LOA/notes 01/04/2011 (06/29/2013)   HPI:      Assessment / Plan / Recommendation Clinical Impression  Patient presents with mild-moderate oral and mild pharyngeal dysphagia, characterized by reduced laryngeal elevation, delayed swallow initiation and decreased lingual and labial strength, resulting in difficulty with patient's ability to manipulate bolus. Patient presented with increased fatigue during oral intake of PO's, but no overt s/s aspiration.     Aspiration Risk  Mild    Diet Recommendation Dysphagia 1 (Puree);Thin liquid   Liquid Administration via: Cup;Straw Medication Administration: Crushed with puree Supervision: Staff to assist with self feeding;Full supervision/cueing for compensatory strategies Compensations: Slow rate;Small sips/bites;Follow solids with liquid (monitor for patient fatigue during PO intake) Postural Changes and/or Swallow Maneuvers: Seated upright 90 degrees    Other  Recommendations  Oral Care Recommendations: Oral care before and after PO   Follow Up Recommendations  24 hour supervision/assistance;Home health SLP;Skilled Nursing facility (pending progress, HH vs SNF level SLP intervention)    Frequency and Duration min 2x/week  2 weeks   Pertinent Vitals/Pain     SLP Swallow Goals  Patient will utilize recommended strategies during swallow to increase swallowing safety with moderate cues/assist.    Patient will tolerate trials of upgrade solids with no overt s/s aspiration.   Swallow Study Prior Functional Status       General Date of Onset: 06/28/13 Type of Study: Bedside swallow evaluation Previous Swallow Assessment: NA Diet Prior to this Study: NPO Temperature Spikes Noted: No History of Recent Intubation: No Behavior/Cognition: Alert;Cooperative;Pleasant mood;Requires cueing;Decreased sustained attention Oral Cavity - Dentition: Dentures, not available;Edentulous Self-Feeding Abilities: Needs assist Patient Positioning: Upright in bed Baseline Vocal Quality:  (patient vocalized/verbalized with increased effort, mostly aphonic) Volitional Cough: Weak Volitional Swallow: Able to elicit    Oral/Motor/Sensory Function Overall Oral Motor/Sensory Function: Impaired Labial ROM: Within Functional Limits Labial Symmetry: Within Functional Limits Labial Strength: Reduced Labial Sensation: Within Functional Limits Lingual Symmetry: Within Functional Limits Lingual Strength: Reduced Lingual Sensation: Within Functional Limits Facial ROM: Within Functional Limits Facial Symmetry: Within Functional Limits Facial Strength: Reduced Facial Sensation: Within Functional Limits Velum: Within Functional Limits Mandible: Within Functional Limits   Ice Chips Ice chips: Not tested   Thin Liquid Thin Liquid: Impaired Presentation: Cup;Straw Oral Phase Impairments: Reduced labial seal Pharyngeal  Phase Impairments: Suspected delayed Swallow;Decreased  hyoid-laryngeal movement Other Comments: No overt s/s aspiration during this BSE    Nectar Thick Nectar Thick Liquid: Not tested   Honey Thick Honey Thick Liquid: Not  tested   Puree Puree: Impaired Presentation: Spoon Oral Phase Impairments: Reduced lingual movement/coordination Pharyngeal Phase Impairments: Suspected delayed Swallow;Decreased hyoid-laryngeal movement Other Comments: No overt s/s aspiration noted during this BSE   Solid   GO    Solid: Not tested Other Comments: Not tested secondary to patient reported dentures are not present here at hospital, and SLP concern of regular solid texture resulting in patient fatigue.       Roberta Carpenter 06/29/2013,6:26 PM  Angela Nevin, MA, CCC-SLP [facility] Speech-Language Pathologist

## 2013-06-29 NOTE — Consult Note (Addendum)
ANTIBIOTIC CONSULT NOTE-Preliminary  Pharmacy Consult for  Zosyn  Indication:  CAP  No Known Allergies  Patient Measurements: Height: 5' 4.17" (163 cm) Weight: 219 lb 5.7 oz (99.5 kg) IBW/kg (Calculated) : 55.1  Vital Signs: Temp: 98.2 F (36.8 C) (12/05 0439) Temp src: Oral (12/05 0439) BP: 103/66 mmHg (12/05 0439) Pulse Rate: 79 (12/05 0439)  Labs:  Recent Labs  06/28/13 1427 06/28/13 1457 06/28/13 2340 06/29/13 0406  WBC  --  25.5*  --  22.4*  HGB  --  12.6  --  11.9*  PLT  --  196  --  181  CREATININE 0.47*  --  0.43* 0.34*    Estimated Creatinine Clearance: 96.8 ml/min (by C-G formula based on Cr of 0.34).  Microbiology: Recent Results (from the past 720 hour(s))  CULTURE, BLOOD (ROUTINE X 2)     Status: None   Collection Time    06/28/13  2:30 PM      Result Value Range Status   Specimen Description BLOOD RIGHT FOREARM   Final   Special Requests     Final   Value: BOTTLES DRAWN AEROBIC AND ANAEROBIC 10CCBLUE 5CCRED   Culture  Setup Time     Final   Value: 06/28/2013 21:27     Performed at [facility]   Culture     Final   Value:        BLOOD CULTURE RECEIVED NO GROWTH TO DATE CULTURE WILL BE HELD FOR 5 DAYS BEFORE ISSUING A FINAL NEGATIVE REPORT     Performed at [facility]   Report Status PENDING   Incomplete    Medical History: Past Medical History  Diagnosis Date  . MS (multiple sclerosis)     [name] 05/21/2000 (06/29/2013)  . Neurogenic bladder     [name] 05/21/2000 (06/29/2013)  . 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 CAP (community acquired pneumonia)     [name] 06/28/2013 (06/29/2013)    Medications:  Prescriptions prior to admission  Medication Sig Dispense Refill  . baclofen (LIORESAL) 10 MG tablet Take 10 mg by mouth 3 (three) times daily.       Marland Kitchen DM-Phenylephrine-Acetaminophen (VICKS DAYQUIL COLD & FLU) 10-5-325 MG/15ML LIQD Take 15 mLs by mouth daily as needed.      Marland Kitchen  guaiFENesin (MUCINEX CHEST CONGESTION CHILD) 100 MG/5ML liquid Take 400 mg by mouth daily as needed for cough.      Marland Kitchen tiZANidine (ZANAFLEX) 4 MG tablet Take 4 mg by mouth 3 (three) times daily.       Scheduled:  . azithromycin  500 mg Intravenous Q24H  . baclofen  10 mg Oral TID  . enoxaparin (LOVENOX) injection  40 mg Subcutaneous Q24H  . piperacillin-tazobactam (ZOSYN)  IV  3.375 g Intravenous Q8H  . potassium chloride  10 mEq Intravenous Q1 Hr x 5  . potassium chloride  40 mEq Oral BID   Anti-infectives   Start     Dose/Rate Route Frequency Ordered Stop   06/29/13 1600  azithromycin (ZITHROMAX) 500 mg in dextrose 5 % 250 mL IVPB     500 mg 250 mL/hr over 60 Minutes Intravenous Every 24 hours 06/28/13 2327 07/06/13 1559   06/29/13 1500  cefTRIAXone (ROCEPHIN) 1 g in dextrose 5 % 50 mL IVPB  Status:  Discontinued     1 g 100 mL/hr over 30 Minutes Intravenous Every 24 hours 06/28/13 2327 06/29/13 0712   06/29/13 0730  piperacillin-tazobactam (  ZOSYN) IVPB 3.375 g     3.375 g 12.5 mL/hr over 240 Minutes Intravenous 3 times per day 06/29/13 0716     06/28/13 1515  cefTRIAXone (ROCEPHIN) 1 g in dextrose 5 % 50 mL IVPB     1 g 100 mL/hr over 30 Minutes Intravenous  Once 06/28/13 1502 06/28/13 1549   06/28/13 1515  azithromycin (ZITHROMAX) 500 mg in dextrose 5 % 250 mL IVPB     500 mg 250 mL/hr over 60 Minutes Intravenous  Once 06/28/13 1502 06/28/13 1700      Assessment:  52 y/o female with a history of Multiple Sclerosis admitted for leukocytosis, fever to 101 F, and productive cough.  Antibiotics started with Azithromycin and Ceftriaxone for presumed CAP.  Ceftriaxone replaced by Zosyn today, pharmacy to adjust as required.  WBC 22.4, Afebrile. Renal function good with CrCl 97 ml/min.   Goal of Therapy:  Antibiotics selected for infection/cultures and adjusted for renal function.    Plan:  1. Continue Azithromycin as ordered for total of 10 days. Begin Zosyn 3.375 gm IV q 8  hours, each dose to infuse over 4 hours Currently there are no issues requiring antibiotic dosage or schedule adjustments. Recommend follow up cultures, clinical course and adjust antibiotic selection/therapies as indicated. Pharmacy will sign off.  If you require any further assistance or have any questions or concerns, please re-consult Pharmacy as needed. Thank you for allowing Pharmacy to participate in this patient's care.   Sandford Diop, Elisha Headland,  Pharm.D. 06/29/2013,11:29 AM   Addendum:     Pharmacy asked to Broaden PNA antibiotic coverage with addition of Vancomycin per protocol.  GOAL / PLAN:  1. Vancomycin troughs 15 - 20 mcg/ml. 2. Begin Vancomycin 1 gm IV q 8 hours. Follow up SCr, UOP, cultures, clinical course and adjust as clinically indicated.  Vancomycin levels as indicated.  Skylan Gift, Elisha Headland,  Pharm.D. , 06/29/2013, 2:46 PM

## 2013-06-29 NOTE — Progress Notes (Signed)
Unable to obtain urine sample without I&O Catheterization.  Pt incontinent.

## 2013-06-29 NOTE — ED Provider Notes (Signed)
Medical screening examination/treatment/procedure(s) were conducted as a shared visit with non-physician practitioner(s) and myself.  I personally evaluated the patient during the encounter.  EKG Interpretation    Date/Time:    Ventricular Rate:    PR Interval:    QRS Duration:   QT Interval:    QTC Calculation:   R Axis:     Text Interpretation:                Candyce Churn, MD 06/29/13 0830

## 2013-06-30 LAB — COMPREHENSIVE METABOLIC PANEL
AST: 18 U/L (ref 0–37)
Albumin: 2.5 g/dL — ABNORMAL LOW (ref 3.5–5.2)
Alkaline Phosphatase: 66 U/L (ref 39–117)
BUN: 12 mg/dL (ref 6–23)
CO2: 18 mEq/L — ABNORMAL LOW (ref 19–32)
Calcium: 8.1 mg/dL — ABNORMAL LOW (ref 8.4–10.5)
Chloride: 111 mEq/L (ref 96–112)
GFR calc non Af Amer: >90 mL/min (ref >90)
Glucose, Bld: 100 mg/dL — ABNORMAL HIGH (ref 70–99)
Potassium: 3.4 mEq/L — ABNORMAL LOW (ref 3.5–5.1)
Total Bilirubin: 0.8 mg/dL (ref 0.3–1.2)

## 2013-06-30 LAB — CULTURE, BLOOD (ROUTINE X 2)

## 2013-06-30 LAB — CBC
HCT: 33.7 % — ABNORMAL LOW (ref 36.0–46.0)
Hemoglobin: 11 g/dL — ABNORMAL LOW (ref 12.0–15.0)
RBC: 3.75 MIL/uL — ABNORMAL LOW (ref 3.87–5.11)
RDW: 15 % (ref 11.5–15.5)
WBC: 14.4 10*3/uL — ABNORMAL HIGH (ref 4.0–10.5)

## 2013-06-30 LAB — URINE CULTURE: Colony Count: 40000

## 2013-06-30 MED ORDER — POTASSIUM CHLORIDE CRYS ER 20 MEQ PO TBCR
40.0000 meq | EXTENDED_RELEASE_TABLET | Freq: Once | ORAL | Status: AC
  Administered 2013-06-30: 40 meq via ORAL
  Filled 2013-06-30: qty 2

## 2013-06-30 MED ORDER — CEFTRIAXONE SODIUM 1 G IJ SOLR
1.0000 g | INTRAMUSCULAR | Status: DC
  Administered 2013-06-30 – 2013-07-01 (×2): 1 g via INTRAVENOUS
  Filled 2013-06-30 (×3): qty 10

## 2013-06-30 NOTE — Progress Notes (Signed)
[facility]                                                                                Patient Demographics  Roberta Carpenter, is a 52 y.o. female, DOB - Jun 03, 1961, WUJ:[id number]  Admit date - 06/28/2013   Admitting Physician Lynden Oxford, MD  Outpatient Primary MD for the patient is Provider Not In System  LOS - 2   Chief Complaint  Patient presents with  . Code Sepsis        Assessment & Plan    1. CAP (community acquired pneumonia) and sepsis  -responding well to empiric antibiotics, speech evaluation done, aspiration precautions, nebulizer treatments and oxygen as needed.     2. Multiple sclerosis with chronic severe paraparesis she is essentially bed bound at baseline - Patient and provided bedside assistance, patient followup with neurologist post discharge.     3. Hyponatremia and Hypokalemia - due to dehydration with IV fluids hyponatremia has completely resolved, potassium is much better we'll repeat again and recheck in the morning.     4. Constipation and stool impaction. Has been disimpacted, bowel regimen initiated. Patient clinically much better.     5. Coag-negative gram-positive blood cultures - a contaminant, we'll de-escalate from vancomycin to Rocephin again.       Code Status: Full  Family Communication: none present  Disposition Plan: Home   Procedures CT Abd   Consults  Speech   Medications  Scheduled Meds: . azithromycin  500 mg Intravenous Q24H  . baclofen  10 mg Oral TID  . bisacodyl  10 mg Rectal Daily  . enoxaparin (LOVENOX) injection  40 mg Subcutaneous Q24H  . piperacillin-tazobactam (ZOSYN)  IV  3.375 g Intravenous Q8H  . polyethylene glycol  17 g Oral BID  . vancomycin  1,000 mg Intravenous Q8H   Continuous Infusions:   PRN Meds:.  DVT Prophylaxis  Lovenox   Lab Results  Component Value Date   PLT 163 06/30/2013    Antibiotics    Anti-infectives   Start     Dose/Rate Route Frequency Ordered Stop    06/29/13 1800  vancomycin (VANCOCIN) IVPB 1000 mg/200 mL premix     1,000 mg 200 mL/hr over 60 Minutes Intravenous Every 8 hours 06/29/13 1448     06/29/13 1600  azithromycin (ZITHROMAX) 500 mg in dextrose 5 % 250 mL IVPB     500 mg 250 mL/hr over 60 Minutes Intravenous Every 24 hours 06/28/13 2327 07/06/13 1559   06/29/13 1500  cefTRIAXone (ROCEPHIN) 1 g in dextrose 5 % 50 mL IVPB  Status:  Discontinued     1 g 100 mL/hr over 30 Minutes Intravenous Every 24 hours 06/28/13 2327 06/29/13 0712   06/29/13 0730  piperacillin-tazobactam (ZOSYN) IVPB 3.375 g     3.375 g 12.5 mL/hr over 240 Minutes Intravenous 3 times per day 06/29/13 0716     06/28/13 1515  cefTRIAXone (ROCEPHIN) 1 g in dextrose 5 % 50 mL IVPB     1 g 100 mL/hr over 30 Minutes Intravenous  Once 06/28/13 1502 06/28/13 1549   06/28/13 1515  azithromycin (ZITHROMAX) 500 mg in dextrose 5 % 250 mL IVPB  500 mg 250 mL/hr over 60 Minutes Intravenous  Once 06/28/13 1502 06/28/13 1700          Subjective:   Enrique Sack today has, No headache, No chest pain, No abdominal pain - No Nausea, No new weakness tingling or numbness, No Cough - SOB.    Objective:   Filed Vitals:   06/29/13 1114 06/29/13 1410 06/29/13 2012 06/30/13 0530  BP:  109/76 94/64 109/75  Pulse:  85 76 75  Temp:  98.1 F (36.7 C) 98.5 F (36.9 C) 98 F (36.7 C)  TempSrc:  Oral Oral Oral  Resp:  18 18 18   Height: 5' 4.17" (1.63 m)     Weight: 99.5 kg (219 lb 5.7 oz)     SpO2:  98% 99% 96%    Wt Readings from Last 3 Encounters:  06/29/13 99.5 kg (219 lb 5.7 oz)  01/19/11 54.795 kg (120 lb 12.8 oz)     Intake/Output Summary (Last 24 hours) at 06/30/13 1141 Last data filed at 06/30/13 0730  Gross per 24 hour  Intake    470 ml  Output      0 ml  Net    470 ml    Exam Awake Alert, Oriented X 3, No new F.N deficits, Normal affect, chronic paraparesis from MS [place].AT,PERRAL Supple Neck,No JVD, No cervical lymphadenopathy appriciated.   Symmetrical Chest wall movement, Good air movement bilaterally, CTAB RRR,No Gallops,Rubs or new Murmurs, No Parasternal Heave +ve B.Sounds, Abd Soft, Non tender, No organomegaly appriciated, No rebound - guarding or rigidity. No Cyanosis, Clubbing or edema, No new Rash or bruise      Data Review   Micro Results Recent Results (from the past 240 hour(s))  CULTURE, BLOOD (ROUTINE X 2)     Status: None   Collection Time    06/28/13  2:30 PM      Result Value Range Status   Specimen Description BLOOD RIGHT FOREARM   Final   Special Requests     Final   Value: BOTTLES DRAWN AEROBIC AND ANAEROBIC 10CCBLUE 5CCRED   Culture  Setup Time     Final   Value: 06/28/2013 21:27     Performed at [facility]   Culture     Final   Value: STAPHYLOCOCCUS SPECIES (COAGULASE NEGATIVE)     Note: THE SIGNIFICANCE OF ISOLATING THIS ORGANISM FROM A SINGLE SET OF BLOOD CULTURES WHEN MULTIPLE SETS ARE DRAWN IS UNCERTAIN. PLEASE NOTIFY THE MICROBIOLOGY DEPARTMENT WITHIN ONE WEEK IF SPECIATION AND SENSITIVITIES ARE REQUIRED.     Note: Gram Stain Report Called to,Read Back By and Verified With: BRITTANY B @ 1245 06/29/13 BY KRAWS     Performed at [facility]   Report Status 06/30/2013 FINAL   Final  URINE CULTURE     Status: None   Collection Time    06/28/13  3:44 PM      Result Value Range Status   Specimen Description URINE, CATHETERIZED   Final   Special Requests NONE   Final   Culture  Setup Time     Final   Value: 06/28/2013 16:13     Performed at [facility]   Colony Count     Final   Value: 40,000 COLONIES/ML     Performed at [facility]   Culture     Final   Value: ESCHERICHIA COLI     Performed at [facility]   Report Status 06/30/2013 FINAL   Final  Organism ID, Bacteria ESCHERICHIA COLI   Final  CULTURE, BLOOD (ROUTINE X 2)     Status: None   Collection Time    06/28/13  4:50 PM      Result Value Range Status   Specimen Description BLOOD HAND  RIGHT   Final   Special Requests BOTTLES DRAWN AEROBIC ONLY 4CC   Final   Culture  Setup Time     Final   Value: 06/29/2013 00:07     Performed at [facility]   Culture     Final   Value:        BLOOD CULTURE RECEIVED NO GROWTH TO DATE CULTURE WILL BE HELD FOR 5 DAYS BEFORE ISSUING A FINAL NEGATIVE REPORT     Performed at [facility]   Report Status PENDING   Incomplete    Radiology Reports Ct Abdomen Pelvis W Contrast  06/28/2013   CLINICAL DATA:  Code sepsis, fever  EXAM: CT ABDOMEN AND PELVIS WITH CONTRAST  TECHNIQUE: Multidetector CT imaging of the abdomen and pelvis was performed using the standard protocol following bolus administration of intravenous contrast.  CONTRAST:  80mL OMNIPAQUE IOHEXOL 300 MG/ML  SOLN  COMPARISON:  Prior CT abdomen/pelvis 01/04/2011  FINDINGS: Lower Chest: Patchy ground-glass attenuation airspace disease and early consolidative changes in the dependent right lower lobe. Visualized cardiac structures within normal limits for size. No pericardial effusion. Unremarkable distal thoracic esophagus.  Abdomen: Unremarkable CT appearance of the stomach, duodenum, spleen, adrenal glands and pancreas. Normal hepatic contour and morphology. Gallbladder is unremarkable. No intra or extrahepatic biliary ductal dilatation.  Unremarkable appearance of the bilateral kidneys. No focal solid lesion, hydronephrosis or nephrolithiasis. Simple cyst in the lower pole of the left kidney.  No evidence of obstruction or focal bowel wall thickening. Normal appendix in the right lower quadrant. The terminal ileum is unremarkable. Large rectal stool ball. Colonic diverticular disease without CT evidence of active inflammation. No free fluid.  Pelvis: Unremarkable appearance of the bladder. Air and fluid noted within the upper vagina. Minimally complex low-attenuation right ovarian cyst versus 2 smaller adjacent cysts. It measured is a single cyst, the maximal diameter is 3.6 cm.  If measured individually, the larger cyst measures 2.8 cm. No definite free fluid or suspicious adenopathy.  Bones/Soft Tissues: No acute fracture or aggressive appearing lytic or blastic osseous lesion.  Vascular: No significant atherosclerotic vascular disease, aneurysmal dilatation or acute abnormality.  IMPRESSION: 1. Patchy ground-glass attenuation opacity and early consolidative changes in the dependent right lower lobe concerning for small volume aspiration versus early bronchopneumonia. 2. Large rectal stool ball concerning for constipation/impaction. 3. Air and fluid noted within the upper vagina. This is of uncertain etiology and clinical significance. 4. Minimally complex versus adjacent simple right ovarian cysts. If the patient is not menopausal, this is almost certainly benign and requires no follow-up.   Electronically Signed   By: Malachy Moan M.D.   On: 06/28/2013 21:07   Dg Chest Port 1 View  06/28/2013   CLINICAL DATA:  Code sepsis  EXAM: PORTABLE CHEST - 1 VIEW  COMPARISON:  None.  FINDINGS: The heart size and mediastinal contours are within normal limits. Lungs are hyperinflated and there are coarsened interstitial markings noted bilaterally. No airspace consolidation. The visualized skeletal structures are unremarkable.  IMPRESSION: 1. No acute cardiopulmonary abnormalities.   Electronically Signed   By: Signa Kell M.D.   On: 06/28/2013 14:57    CBC  Recent Labs Lab 06/28/13 1457 06/29/13 0406  06/30/13 0410  WBC 25.5* 22.4* 14.4*  HGB 12.6 11.9* 11.0*  HCT 37.6 36.3 33.7*  PLT 196 181 163  MCV 90.2 90.1 89.9  MCH 30.2 29.5 29.3  MCHC 33.5 32.8 32.6  RDW 15.5 15.4 15.0  LYMPHSABS 2.0 2.5  --   MONOABS 2.3* 1.7*  --   EOSABS 0.0 0.1  --   BASOSABS 0.0 0.0  --     Chemistries   Recent Labs Lab 06/28/13 1427 06/28/13 2340 06/29/13 0406 06/29/13 1325 06/30/13 0410  NA 151* 149* 148*  --  141  K 3.3* 2.8* 2.8* 4.2 3.4*  CL 111 115* 114*  --  111  CO2 19  21 20   --  18*  GLUCOSE 123* 106* 91  --  100*  BUN 21 16 15   --  12  CREATININE 0.47* 0.43* 0.34*  --  0.34*  CALCIUM 10.0 8.7 8.8  --  8.1*  MG  --   --   --  1.8  --   AST 27  --  20  --  18  ALT 27  --  19  --  14  ALKPHOS 100  --  78  --  66  BILITOT 1.4*  --  1.2  --  0.8   ------------------------------------------------------------------------------------------------------------------ estimated creatinine clearance is 96.8 ml/min (by C-G formula based on Cr of 0.34). ------------------------------------------------------------------------------------------------------------------ No results found for this basename: HGBA1C,  in the last 72 hours ------------------------------------------------------------------------------------------------------------------ No results found for this basename: CHOL, HDL, LDLCALC, TRIG, CHOLHDL, LDLDIRECT,  in the last 72 hours ------------------------------------------------------------------------------------------------------------------ No results found for this basename: TSH, T4TOTAL, FREET3, T3FREE, THYROIDAB,  in the last 72 hours ------------------------------------------------------------------------------------------------------------------ No results found for this basename: VITAMINB12, FOLATE, FERRITIN, TIBC, IRON, RETICCTPCT,  in the last 72 hours  Coagulation profile No results found for this basename: INR, PROTIME,  in the last 168 hours  No results found for this basename: DDIMER,  in the last 72 hours  Cardiac Enzymes No results found for this basename: CK, CKMB, TROPONINI, MYOGLOBIN,  in the last 168 hours ------------------------------------------------------------------------------------------------------------------ No components found with this basename: POCBNP,      Time Spent in minutes  35   Lathyn Griggs K M.D on 06/30/2013 at 11:41 AM  Between 7am to 7pm - Pager - [phone]  After 7pm go to [email or web address] -  password [email or web address]  And look for the night coverage person covering for me after hours  [facility] Office  [phone]

## 2013-07-01 LAB — CBC
MCH: 29.4 pg (ref 26.0–34.0)
MCV: 88 fL (ref 78.0–100.0)
Platelets: 195 10*3/uL (ref 150–400)
RBC: 3.43 MIL/uL — ABNORMAL LOW (ref 3.87–5.11)
RDW: 14.9 % (ref 11.5–15.5)
WBC: 9.4 10*3/uL (ref 4.0–10.5)

## 2013-07-01 LAB — MAGNESIUM: Magnesium: 2.3 mg/dL (ref 1.5–2.5)

## 2013-07-01 MED ORDER — POTASSIUM CHLORIDE CRYS ER 20 MEQ PO TBCR: 40.0000 meq | EXTENDED_RELEASE_TABLET | Freq: Once | ORAL | Status: DC

## 2013-07-01 MED ORDER — POTASSIUM CHLORIDE 10 MEQ/100ML IV SOLN: 10.0000 meq | INTRAVENOUS | Status: DC

## 2013-07-01 MED ORDER — POTASSIUM CHLORIDE CRYS ER 20 MEQ PO TBCR
40.0000 meq | EXTENDED_RELEASE_TABLET | ORAL | Status: AC
  Administered 2013-07-01 (×2): 40 meq via ORAL
  Filled 2013-07-01 (×2): qty 2

## 2013-07-01 MED ORDER — POTASSIUM CHLORIDE 10 MEQ/100ML IV SOLN
10.0000 meq | INTRAVENOUS | Status: AC
  Administered 2013-07-01 (×4): 10 meq via INTRAVENOUS
  Filled 2013-07-01 (×4): qty 100

## 2013-07-01 MED ORDER — MAGNESIUM SULFATE IN D5W 10-5 MG/ML-% IV SOLN
1.0000 g | Freq: Once | INTRAVENOUS | Status: AC
  Administered 2013-07-01: 1 g via INTRAVENOUS
  Filled 2013-07-01: qty 100

## 2013-07-01 NOTE — Progress Notes (Signed)
[facility]                                                                                Patient Demographics  Roberta Carpenter, is a 52 y.o. female, DOB - Dec 12, 1960, WUJ:[id number]  Admit date - 06/28/2013   Admitting Physician Lynden Oxford, MD  Outpatient Primary MD for the patient is Provider Not In System  LOS - 3   Chief Complaint  Patient presents with  . Code Sepsis        Assessment & Plan    1. CAP (community acquired pneumonia) and sepsis  -responding well to empiric antibiotics, speech evaluation done, aspiration precautions, nebulizer treatments and oxygen as needed.     2. Multiple sclerosis with chronic severe paraparesis she is essentially bed bound at baseline - Patient and provided bedside assistance, patient followup with neurologist post discharge.     3. Hyponatremia  - due to dehydration with IV fluids hyponatremia has completely resolved.     4. Constipation and stool impaction. Has been disimpacted, bowel regimen initiated. Patient clinically much better.     5. Coag-negative gram-positive blood cultures - a contaminant, we'll de-escalate from vancomycin to Rocephin again.     6.Hypokelmia - replace and monitor.      Code Status: Full  Family Communication: mother on phone  Disposition Plan: Home   Procedures CT Abd   Consults  Speech   Medications  Scheduled Meds: . azithromycin  500 mg Intravenous Q24H  . baclofen  10 mg Oral TID  . bisacodyl  10 mg Rectal Daily  . cefTRIAXone (ROCEPHIN)  IV  1 g Intravenous Q24H  . enoxaparin (LOVENOX) injection  40 mg Subcutaneous Q24H  . piperacillin-tazobactam (ZOSYN)  IV  3.375 g Intravenous Q8H  . polyethylene glycol  17 g Oral BID  . potassium chloride  10 mEq Intravenous Q1 Hr x 4  . potassium chloride  40 mEq Oral Q4H   Continuous Infusions:   PRN Meds:.  DVT Prophylaxis  Lovenox   Lab Results  Component Value Date   PLT 195 07/01/2013    Antibiotics     Anti-infectives   Start     Dose/Rate Route Frequency Ordered Stop   06/30/13 1200  cefTRIAXone (ROCEPHIN) 1 g in dextrose 5 % 50 mL IVPB     1 g 100 mL/hr over 30 Minutes Intravenous Every 24 hours 06/30/13 1144     06/29/13 1800  vancomycin (VANCOCIN) IVPB 1000 mg/200 mL premix  Status:  Discontinued     1,000 mg 200 mL/hr over 60 Minutes Intravenous Every 8 hours 06/29/13 1448 06/30/13 1144   06/29/13 1600  azithromycin (ZITHROMAX) 500 mg in dextrose 5 % 250 mL IVPB     500 mg 250 mL/hr over 60 Minutes Intravenous Every 24 hours 06/28/13 2327 07/06/13 1559   06/29/13 1500  cefTRIAXone (ROCEPHIN) 1 g in dextrose 5 % 50 mL IVPB  Status:  Discontinued     1 g 100 mL/hr over 30 Minutes Intravenous Every 24 hours 06/28/13 2327 06/29/13 0712   06/29/13 0730  piperacillin-tazobactam (ZOSYN) IVPB 3.375 g     3.375 g 12.5 mL/hr over 240 Minutes Intravenous 3  times per day 06/29/13 0716     06/28/13 1515  cefTRIAXone (ROCEPHIN) 1 g in dextrose 5 % 50 mL IVPB     1 g 100 mL/hr over 30 Minutes Intravenous  Once 06/28/13 1502 06/28/13 1549   06/28/13 1515  azithromycin (ZITHROMAX) 500 mg in dextrose 5 % 250 mL IVPB     500 mg 250 mL/hr over 60 Minutes Intravenous  Once 06/28/13 1502 06/28/13 1700          Subjective:   Roberta Carpenter today has, No headache, No chest pain, No abdominal pain - No Nausea, No new weakness tingling or numbness, No Cough - SOB.    Objective:   Filed Vitals:   06/30/13 0530 06/30/13 1300 06/30/13 2006 07/01/13 0438  BP: 109/75 110/72 99/65 108/72  Pulse: 75 74 89 60  Temp: 98 F (36.7 C) 97.7 F (36.5 C) 98.4 F (36.9 C) 97.9 F (36.6 C)  TempSrc: Oral Oral Oral Oral  Resp: 18 19 17 17   Height:      Weight:      SpO2: 96% 99% 98% 100%    Wt Readings from Last 3 Encounters:  06/29/13 99.5 kg (219 lb 5.7 oz)  01/19/11 54.795 kg (120 lb 12.8 oz)     Intake/Output Summary (Last 24 hours) at 07/01/13 0959 Last data filed at 07/01/13 0700  Gross  per 24 hour  Intake    460 ml  Output      0 ml  Net    460 ml    Exam Awake Alert, Oriented X 3, No new F.N deficits, Normal affect, chronic paraparesis from MS Roberta Carpenter.AT,PERRAL Supple Neck,No JVD, No cervical lymphadenopathy appriciated.  Symmetrical Chest wall movement, Good air movement bilaterally, CTAB RRR,No Gallops,Rubs or new Murmurs, No Parasternal Heave +ve B.Sounds, Abd Soft, Non tender, No organomegaly appriciated, No rebound - guarding or rigidity. No Cyanosis, Clubbing or edema, No new Rash or bruise      Data Review   Micro Results Recent Results (from the past 240 hour(s))  CULTURE, BLOOD (ROUTINE X 2)     Status: None   Collection Time    06/28/13  2:30 PM      Result Value Range Status   Specimen Description BLOOD RIGHT FOREARM   Final   Special Requests     Final   Value: BOTTLES DRAWN AEROBIC AND ANAEROBIC 10CCBLUE 5CCRED   Culture  Setup Time     Final   Value: 06/28/2013 21:27     Performed at [facility]   Culture     Final   Value: STAPHYLOCOCCUS SPECIES (COAGULASE NEGATIVE)     Note: THE SIGNIFICANCE OF ISOLATING THIS ORGANISM FROM A SINGLE SET OF BLOOD CULTURES WHEN MULTIPLE SETS ARE DRAWN IS UNCERTAIN. PLEASE NOTIFY THE MICROBIOLOGY DEPARTMENT WITHIN ONE WEEK IF SPECIATION AND SENSITIVITIES ARE REQUIRED.     Note: Gram Stain Report Called to,Read Back By and Verified With: BRITTANY B @ 1245 06/29/13 BY KRAWS     Performed at [facility]   Report Status 06/30/2013 FINAL   Final  URINE CULTURE     Status: None   Collection Time    06/28/13  3:44 PM      Result Value Range Status   Specimen Description URINE, CATHETERIZED   Final   Special Requests NONE   Final   Culture  Setup Time     Final   Value: 06/28/2013 16:13     Performed at [facility]   Colony Count     Final   Value: 40,000 COLONIES/ML     Performed at [facility]     Final   Value: ESCHERICHIA COLI     Performed at [facility]    Report Status 06/30/2013 FINAL   Final   Organism ID, Bacteria ESCHERICHIA COLI   Final  CULTURE, BLOOD (ROUTINE X 2)     Status: None   Collection Time    06/28/13  4:50 PM      Result Value Range Status   Specimen Description BLOOD HAND RIGHT   Final   Special Requests BOTTLES DRAWN AEROBIC ONLY 4CC   Final   Culture  Setup Time     Final   Value: 06/29/2013 00:07     Performed at [facility]   Culture     Final   Value:        BLOOD CULTURE RECEIVED NO GROWTH TO DATE CULTURE WILL BE HELD FOR 5 DAYS BEFORE ISSUING A FINAL NEGATIVE REPORT     Performed at [facility]   Report Status PENDING   Incomplete    Radiology Reports Ct Abdomen Pelvis W Contrast  06/28/2013   CLINICAL DATA:  Code sepsis, fever  EXAM: CT ABDOMEN AND PELVIS WITH CONTRAST  TECHNIQUE: Multidetector CT imaging of the abdomen and pelvis was performed using the standard protocol following bolus administration of intravenous contrast.  CONTRAST:  80mL OMNIPAQUE IOHEXOL 300 MG/ML  SOLN  COMPARISON:  Prior CT abdomen/pelvis 01/04/2011  FINDINGS: Lower Chest: Patchy ground-glass attenuation airspace disease and early consolidative changes in the dependent right lower lobe. Visualized cardiac structures within normal limits for size. No pericardial effusion. Unremarkable distal thoracic esophagus.  Abdomen: Unremarkable CT appearance of the stomach, duodenum, spleen, adrenal glands and pancreas. Normal hepatic contour and morphology. Gallbladder is unremarkable. No intra or extrahepatic biliary ductal dilatation.  Unremarkable appearance of the bilateral kidneys. No focal solid lesion, hydronephrosis or nephrolithiasis. Simple cyst in the lower pole of the left kidney.  No evidence of obstruction or focal bowel wall thickening. Normal appendix in the right lower quadrant. The terminal ileum is unremarkable. Large rectal stool ball. Colonic diverticular disease without CT evidence of active inflammation. No free  fluid.  Pelvis: Unremarkable appearance of the bladder. Air and fluid noted within the upper vagina. Minimally complex low-attenuation right ovarian cyst versus 2 smaller adjacent cysts. It measured is a single cyst, the maximal diameter is 3.6 cm. If measured individually, the larger cyst measures 2.8 cm. No definite free fluid or suspicious adenopathy.  Bones/Soft Tissues: No acute fracture or aggressive appearing lytic or blastic osseous lesion.  Vascular: No significant atherosclerotic vascular disease, aneurysmal dilatation or acute abnormality.  IMPRESSION: 1. Patchy ground-glass attenuation opacity and early consolidative changes in the dependent right lower lobe concerning for small volume aspiration versus early bronchopneumonia. 2. Large rectal stool ball concerning for constipation/impaction. 3. Air and fluid noted within the upper vagina. This is of uncertain etiology and clinical significance. 4. Minimally complex versus adjacent simple right ovarian cysts. If the patient is not menopausal, this is almost certainly benign and requires no follow-up.   Electronically Signed   By: Malachy Moan M.D.   On: 06/28/2013 21:07   Dg Chest Port 1 View  06/28/2013   CLINICAL DATA:  Code sepsis  EXAM: PORTABLE CHEST - 1 VIEW  COMPARISON:  None.  FINDINGS: The heart size and mediastinal contours are within  normal limits. Lungs are hyperinflated and there are coarsened interstitial markings noted bilaterally. No airspace consolidation. The visualized skeletal structures are unremarkable.  IMPRESSION: 1. No acute cardiopulmonary abnormalities.   Electronically Signed   By: Signa Kell M.D.   On: 06/28/2013 14:57    CBC  Recent Labs Lab 06/28/13 1457 06/29/13 0406 06/30/13 0410 07/01/13 0500  WBC 25.5* 22.4* 14.4* 9.4  HGB 12.6 11.9* 11.0* 10.1*  HCT 37.6 36.3 33.7* 30.2*  PLT 196 181 163 195  MCV 90.2 90.1 89.9 88.0  MCH 30.2 29.5 29.3 29.4  MCHC 33.5 32.8 32.6 33.4  RDW 15.5 15.4 15.0 14.9   LYMPHSABS 2.0 2.5  --   --   MONOABS 2.3* 1.7*  --   --   EOSABS 0.0 0.1  --   --   BASOSABS 0.0 0.0  --   --     Chemistries   Recent Labs Lab 06/28/13 1427 06/28/13 2340 06/29/13 0406 06/29/13 1325 06/30/13 0410 07/01/13 0500  NA 151* 149* 148*  --  141  --   K 3.3* 2.8* 2.8* 4.2 3.4* 2.8*  CL 111 115* 114*  --  111  --   CO2 19 21 20   --  18*  --   GLUCOSE 123* 106* 91  --  100*  --   BUN 21 16 15   --  12  --   CREATININE 0.47* 0.43* 0.34*  --  0.34*  --   CALCIUM 10.0 8.7 8.8  --  8.1*  --   MG  --   --   --  1.8  --   --   AST 27  --  20  --  18  --   ALT 27  --  19  --  14  --   ALKPHOS 100  --  78  --  66  --   BILITOT 1.4*  --  1.2  --  0.8  --    ------------------------------------------------------------------------------------------------------------------ estimated creatinine clearance is 96.8 ml/min (by C-G formula based on Cr of 0.34). ------------------------------------------------------------------------------------------------------------------ No results found for this basename: HGBA1C,  in the last 72 hours ------------------------------------------------------------------------------------------------------------------ No results found for this basename: CHOL, HDL, LDLCALC, TRIG, CHOLHDL, LDLDIRECT,  in the last 72 hours ------------------------------------------------------------------------------------------------------------------ No results found for this basename: TSH, T4TOTAL, FREET3, T3FREE, THYROIDAB,  in the last 72 hours ------------------------------------------------------------------------------------------------------------------ No results found for this basename: VITAMINB12, FOLATE, FERRITIN, TIBC, IRON, RETICCTPCT,  in the last 72 hours  Coagulation profile No results found for this basename: INR, PROTIME,  in the last 168 hours  No results found for this basename: DDIMER,  in the last 72 hours  Cardiac Enzymes No results found  for this basename: CK, CKMB, TROPONINI, MYOGLOBIN,  in the last 168 hours ------------------------------------------------------------------------------------------------------------------ No components found with this basename: POCBNP,      Time Spent in minutes  35   Rogue Pautler K M.D on 07/01/2013 at 9:59 AM  Between 7am to 7pm - Pager - [phone]  After 7pm go to [email or web address] - password [email or web address]  And look for the night coverage person covering for me after hours  [facility] Office  [phone]

## 2013-07-01 NOTE — Plan of Care (Signed)
Problem: Phase II Progression Outcomes Goal: ADLs completed with minimal assistance Outcome: Not Applicable Date Met:  07/01/13 Pt total care

## 2013-07-02 MED ORDER — AMOXICILLIN-POT CLAVULANATE 500-125 MG PO TABS: 1.0000 | ORAL_TABLET | Freq: Three times a day (TID) | ORAL | Status: DC

## 2013-07-02 MED ORDER — AZITHROMYCIN 250 MG PO TABS: 250.0000 mg | ORAL_TABLET | Freq: Every day | ORAL | Status: DC

## 2013-07-02 MED ORDER — AZITHROMYCIN 250 MG PO TABS
250.0000 mg | ORAL_TABLET | Freq: Every day | ORAL | Status: DC
  Filled 2013-07-02: qty 1

## 2013-07-02 NOTE — Progress Notes (Addendum)
07/02/2013 12:06 PM Nursing note Discharge avs reviewed with patient's mother over the telephone per request due to families inability to be present at time of discharge. Medications already taken today and those due this evening reviewed. Follow up appointments and when to call MD reviewed. D/c iv line. D/c tele. Pt. To be transported home via ambulance per mother's request. Pt. Mother and sister to be home to receive patient at discharge.  D/c home per orders.  Roberta Carpenter, Blanchard Kelch

## 2013-07-02 NOTE — Discharge Summary (Signed)
[facility]                                                                                   Roberta Carpenter, is a 52 y.o. female  DOB 1960-09-25  MRN [id number].  Admission date:  06/28/2013  Admitting Physician  Lynden Oxford, MD  Discharge Date:  07/02/2013   Primary MD  Provider Not In System  Recommendations for primary care physician for things to follow:   Repeat 2 view chest x-ray in one week   Admission Diagnosis  Pyelonephritis [590.80] CAP (community acquired pneumonia) [486] Sepsis [038.9, 995.91]  Discharge Diagnosis   possible aspiration pneumonia, UTI  Principal Problem:   CAP (community acquired pneumonia) Active Problems:   Multiple sclerosis      Past Medical History  Diagnosis Date  . MS (multiple sclerosis)     [name] 05/21/2000 (06/29/2013)  . Neurogenic bladder     [name] 05/21/2000 (06/29/2013)  . 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 CAP (community acquired pneumonia)     [name] 06/28/2013 (06/29/2013)    Past Surgical History  Procedure Laterality Date  . Colon surgery    . Appendectomy    . Knee arthroscopy Right     [name] 05/21/2000 (06/29/2013)  . Abdominal exploration surgery  04/2009    w/LOA/notes 05/19/2009 06/29/2013)  . Abdominal hysterectomy  04/2009    supracervical/notes 05/19/2009 (06/29/2013)  . Exploratory laparotomy  12/2010    w/LOA/notes 01/04/2011 (06/29/2013)  . Exploratory laparotomy  12/2010    w/LOA/notes 01/04/2011 (06/29/2013)     Discharge Condition: Stable   Follow-up Information   Follow up with PCP. Schedule an appointment as soon as possible for a visit in 1 week.        Consults obtained - physical therapy, speech   Discharge Medications      Medication List         amoxicillin-clavulanate 500-125 MG per tablet  Commonly known as:  AUGMENTIN  Take 1 tablet (500 mg total) by mouth 3 (three) times daily.     azithromycin 250 MG tablet  Commonly  known as:  ZITHROMAX Z-PAK  Take 1 tablet (250 mg total) by mouth daily. Take as directed     baclofen 10 MG tablet  Commonly known as:  LIORESAL  Take 10 mg by mouth 3 (three) times daily.     MUCINEX CHEST CONGESTION CHILD 100 MG/5ML liquid  Generic drug:  guaiFENesin  Take 400 mg by mouth daily as needed for cough.     tiZANidine 4 MG tablet  Commonly known as:  ZANAFLEX  Take 4 mg by mouth 3 (three) times daily.     VICKS DAYQUIL COLD & FLU 10-5-325 MG/15ML Liqd  Generic drug:  DM-Phenylephrine-Acetaminophen  Take 15 mLs by mouth daily as needed.         Diet and Activity recommendation: See Discharge Instructions below   Discharge Instructions     Follow with Primary MD in 7 days   Get CBC, CMP, checked 7 days by Primary MD and again as instructed by your Primary MD. Repeat 2 view chest x-ray next visit  Get Medicines reviewed and adjusted.  Please request your Prim.MD to go over all Hospital Tests and Procedure/Radiological results at the follow up, please get all Hospital records sent to your Prim MD by signing hospital release before you go home.  Activity: As tolerated with Full fall precautions use walker/cane & assistance as needed   Diet:  Dysphagia 1 diet with full feeding assistance and aspiration precautions  For Heart failure patients - Check your Weight same time everyday, if you gain over 2 pounds, or you develop in leg swelling, experience more shortness of breath or chest pain, call your Primary MD immediately. Follow Cardiac Low Salt Diet and 1.8 lit/day fluid restriction.  Disposition Home   If you experience worsening of your admission symptoms, develop shortness of breath, life threatening emergency, suicidal or homicidal thoughts you must seek medical attention immediately by calling 911 or calling your MD immediately  if symptoms less severe.  You Must read complete instructions/literature along with all the possible adverse reactions/side  effects for all the Medicines you take and that have been prescribed to you. Take any new Medicines after you have completely understood and accpet all the possible adverse reactions/side effects.   Do not drive and provide baby sitting services if your were admitted for syncope or siezures until you have seen by Primary MD or a Neurologist and advised to do so again.  Do not drive when taking Pain medications.    Do not take more than prescribed Pain, Sleep and Anxiety Medications  Special Instructions: If you have smoked or chewed Tobacco  in the last 2 yrs please stop smoking, stop any regular Alcohol  and or any Recreational drug use.  Wear Seat belts while driving.   Please note  You were cared for by a hospitalist during your hospital stay. If you have any questions about your discharge medications or the care you received while you were in the hospital after you are discharged, you can call the unit and asked to speak with the hospitalist on call if the hospitalist that took care of you is not available. Once you are discharged, your primary care physician will handle any further medical issues. Please note that NO REFILLS for any discharge medications will be authorized once you are discharged, as it is imperative that you return to your primary care physician (or establish a relationship with a primary care physician if you do not have one) for your aftercare needs so that they can reassess your need for medications and monitor your lab values.   Major procedures and Radiology Reports - PLEASE review detailed and final reports for all details, in brief -       Ct Abdomen Pelvis W Contrast  06/28/2013   CLINICAL DATA:  Code sepsis, fever  EXAM: CT ABDOMEN AND PELVIS WITH CONTRAST  TECHNIQUE: Multidetector CT imaging of the abdomen and pelvis was performed using the standard protocol following bolus administration of intravenous contrast.  CONTRAST:  80mL OMNIPAQUE IOHEXOL 300 MG/ML   SOLN  COMPARISON:  Prior CT abdomen/pelvis 01/04/2011  FINDINGS: Lower Chest: Patchy ground-glass attenuation airspace disease and early consolidative changes in the dependent right lower lobe. Visualized cardiac structures within normal limits for size. No pericardial effusion. Unremarkable distal thoracic esophagus.  Abdomen: Unremarkable CT appearance of the stomach, duodenum, spleen, adrenal glands and pancreas. Normal hepatic contour and morphology. Gallbladder is unremarkable. No intra or extrahepatic biliary ductal dilatation.  Unremarkable appearance of the bilateral kidneys. No focal solid lesion, hydronephrosis  or nephrolithiasis. Simple cyst in the lower pole of the left kidney.  No evidence of obstruction or focal bowel wall thickening. Normal appendix in the right lower quadrant. The terminal ileum is unremarkable. Large rectal stool ball. Colonic diverticular disease without CT evidence of active inflammation. No free fluid.  Pelvis: Unremarkable appearance of the bladder. Air and fluid noted within the upper vagina. Minimally complex low-attenuation right ovarian cyst versus 2 smaller adjacent cysts. It measured is a single cyst, the maximal diameter is 3.6 cm. If measured individually, the larger cyst measures 2.8 cm. No definite free fluid or suspicious adenopathy.  Bones/Soft Tissues: No acute fracture or aggressive appearing lytic or blastic osseous lesion.  Vascular: No significant atherosclerotic vascular disease, aneurysmal dilatation or acute abnormality.  IMPRESSION: 1. Patchy ground-glass attenuation opacity and early consolidative changes in the dependent right lower lobe concerning for small volume aspiration versus early bronchopneumonia. 2. Large rectal stool ball concerning for constipation/impaction. 3. Air and fluid noted within the upper vagina. This is of uncertain etiology and clinical significance. 4. Minimally complex versus adjacent simple right ovarian cysts. If the patient is  not menopausal, this is almost certainly benign and requires no follow-up.   Electronically Signed   By: Malachy Moan M.D.   On: 06/28/2013 21:07   Dg Chest Port 1 View  06/28/2013   CLINICAL DATA:  Code sepsis  EXAM: PORTABLE CHEST - 1 VIEW  COMPARISON:  None.  FINDINGS: The heart size and mediastinal contours are within normal limits. Lungs are hyperinflated and there are coarsened interstitial markings noted bilaterally. No airspace consolidation. The visualized skeletal structures are unremarkable.  IMPRESSION: 1. No acute cardiopulmonary abnormalities.   Electronically Signed   By: Signa Kell M.D.   On: 06/28/2013 14:57    Micro Results      Recent Results (from the past 240 hour(s))  CULTURE, BLOOD (ROUTINE X 2)     Status: None   Collection Time    06/28/13  2:30 PM      Result Value Range Status   Specimen Description BLOOD RIGHT FOREARM   Final   Special Requests     Final   Value: BOTTLES DRAWN AEROBIC AND ANAEROBIC 10CCBLUE 5CCRED   Culture  Setup Time     Final   Value: 06/28/2013 21:27     Performed at [facility]   Culture     Final   Value: STAPHYLOCOCCUS SPECIES (COAGULASE NEGATIVE)     Note: THE SIGNIFICANCE OF ISOLATING THIS ORGANISM FROM A SINGLE SET OF BLOOD CULTURES WHEN MULTIPLE SETS ARE DRAWN IS UNCERTAIN. PLEASE NOTIFY THE MICROBIOLOGY DEPARTMENT WITHIN ONE WEEK IF SPECIATION AND SENSITIVITIES ARE REQUIRED.     Note: Gram Stain Report Called to,Read Back By and Verified With: BRITTANY B @ 1245 06/29/13 BY KRAWS     Performed at [facility]   Report Status 06/30/2013 FINAL   Final  URINE CULTURE     Status: None   Collection Time    06/28/13  3:44 PM      Result Value Range Status   Specimen Description URINE, CATHETERIZED   Final   Special Requests NONE   Final   Culture  Setup Time     Final   Value: 06/28/2013 16:13     Performed at [facility]   Colony Count     Final   Value: 40,000 COLONIES/ML     Performed at  [facility]  Final   Value: ESCHERICHIA COLI     Performed at [facility]   Report Status 06/30/2013 FINAL   Final   Organism ID, Bacteria ESCHERICHIA COLI   Final  CULTURE, BLOOD (ROUTINE X 2)     Status: None   Collection Time    06/28/13  4:50 PM      Result Value Range Status   Specimen Description BLOOD HAND RIGHT   Final   Special Requests BOTTLES DRAWN AEROBIC ONLY 4CC   Final   Culture  Setup Time     Final   Value: 06/29/2013 00:07     Performed at [facility]   Culture     Final   Value:        BLOOD CULTURE RECEIVED NO GROWTH TO DATE CULTURE WILL BE HELD FOR 5 DAYS BEFORE ISSUING A FINAL NEGATIVE REPORT     Performed at [facility]   Report Status PENDING   Incomplete     History of present illness and  Hospital Course:     Kindly see H&P for history of present illness and admission details, please review complete Labs, Consult reports and Test reports for all details in brief Roberta Carpenter, is a 52 y.o. female, patient with history of  multiple sclerosis was essentially bedbound for the last several years, was brought into the hospital secondary to cough fever and shortness of breath clinically unity acquired aspiration pneumonia. She also had a possible UTI versus colonization.   She was adequately treated with empiric IV antibiotics, she should clinical signs of resolution of her infection and now is at baseline, she is afebrile, leukocytosis is resolved. No cough or shortness of breath. She is been seen by speech therapy and placed on dysphagia 1 diet with aspiration precautions and feeding assistance which will be continued at home. She will get home health, RN, speech therapy for followup. She will be placed on 5 more days of oral antibiotics for pneumonia. Her questionable UTI has been adequately treated.    I have discussed the plan with her mother who the main care provider at home who agrees with the  plan.     Today   Subjective:   Roberta Carpenter today has no headache,no chest abdominal pain,no new weakness tingling or numbness, feels much better wants to go home today.    Objective:   Blood pressure 114/78, pulse 71, temperature 98.2 F (36.8 C), temperature source Oral, resp. rate 18, height 5' 4.17" (1.63 m), weight 99.5 kg (219 lb 5.7 oz), SpO2 99.00%.   Intake/Output Summary (Last 24 hours) at 07/02/13 1031 Last data filed at 07/02/13 0730  Gross per 24 hour  Intake   1600 ml  Output      0 ml  Net   1600 ml    Exam Awake Alert, Oriented *3, No new F.N deficits, Normal affect, chronic generalized weakness secondary to MS lower extremity worse than upper [place].AT,PERRAL Supple Neck,No JVD, No cervical lymphadenopathy appriciated.  Symmetrical Chest wall movement, Good air movement bilaterally, CTAB RRR,No Gallops,Rubs or new Murmurs, No Parasternal Heave +ve B.Sounds, Abd Soft, Non tender, No organomegaly appriciated, No rebound -guarding or rigidity. No Cyanosis, Clubbing or edema, No new Rash or bruise  Data Review   CBC w Diff: Lab Results  Component Value Date   WBC 9.4 07/01/2013   HGB 10.1* 07/01/2013   HCT 30.2* 07/01/2013   PLT 195 07/01/2013   LYMPHOPCT 11* 06/29/2013   MONOPCT  8 06/29/2013   EOSPCT 0 06/29/2013   BASOPCT 0 06/29/2013    CMP: Lab Results  Component Value Date   NA 141 06/30/2013   K 3.8 07/02/2013   CL 111 06/30/2013   CO2 18* 06/30/2013   BUN 12 06/30/2013   CREATININE 0.34* 06/30/2013   PROT 6.2 06/30/2013   ALBUMIN 2.5* 06/30/2013   BILITOT 0.8 06/30/2013   ALKPHOS 66 06/30/2013   AST 18 06/30/2013   ALT 14 06/30/2013  .   Total Time in preparing paper work, data evaluation and todays exam - 35 minutes  Leroy Sea M.D on 07/02/2013 at 10:31 AM  [facility] Office  [phone]

## 2013-07-02 NOTE — Clinical Social Work Note (Signed)
CSW received a call from [facility] re: PTAR transportation home.  CSW confirmed that RNCM confirmed address.  CSW also confirmed that pt mother will be home awaiting the arrival of pt home.  RN aware.  CSW will arrange transpiration at noon.    Vickii Penna, LCSWA [phone]  Clinical Social Work

## 2013-07-02 NOTE — Care Management Note (Signed)
    Page 1 of 2   07/02/2013     4:50:49 PM   CARE MANAGEMENT NOTE 07/02/2013  Patient:  Roberta Carpenter, Roberta Carpenter   Account Number:  [id number]  Date Initiated:  06/29/2013  Documentation initiated by:  [facility]  Subjective/Objective Assessment:   Admitted with SIRS - pneumonia?? - constipation.     Action/Plan:   Anticipated DC Date:  07/04/2013   Anticipated DC Plan:  HOME W HOME HEALTH SERVICES  In-house referral  Clinical Social Worker      DC Associate Professor  CM consult      [facility] Choice  HOME HEALTH   Choice offered to / List presented to:  C-1 Patient        HH arranged  HH-1 RN  HH-2 PT  HH-5 SPEECH THERAPY  HH-6 SOCIAL WORKER      [facility] agency  [facility]   Status of service:  Completed, signed off Medicare Important Message given?   (If response is "NO", the following Medicare IM given date fields will be blank) Date Medicare IM given:   Date Additional Medicare IM given:    Discharge Disposition:  HOME W HOME HEALTH SERVICES  Per UR Regulation:  Reviewed for med. necessity/level of care/duration of stay  If discussed at Long Length of Stay Meetings, dates discussed:    Comments:  ContactCristy Carpenter Mother [phone]  07/02/13 Roberta Carpenter [phone] PT FOR DC HOME TODAY.  SPOKE WITH PT'S MOTHER BY PHONE TO [facility].  REFERRAL TO [facility], PER CHOICE.  START OF CARE 24-48H POST DC DATE.  PT HAS HH AIDE WITH [facility], PER MOTHER.  MOTHER DENIES ANY DME NEEDS; REQUEST AMBULANCE TRANSPORT HOME.  REFERRAL TO CSW FOR PTAR TRANSPORT.

## 2013-07-05 LAB — CULTURE, BLOOD (ROUTINE X 2): Culture: NO GROWTH

## 2013-07-10 ENCOUNTER — Emergency Department (HOSPITAL_COMMUNITY): Payer: Medicare Other

## 2013-07-10 ENCOUNTER — Encounter (HOSPITAL_COMMUNITY): Payer: Self-pay | Admitting: Nurse Practitioner

## 2013-07-10 ENCOUNTER — Inpatient Hospital Stay (HOSPITAL_COMMUNITY)
Admission: EM | Admit: 2013-07-10 | Discharge: 2013-07-18 | DRG: 871 | Disposition: A | Discharge status: Hospice | Payer: Medicare Other | Attending: Internal Medicine | Admitting: Internal Medicine

## 2013-07-10 ENCOUNTER — Inpatient Hospital Stay (HOSPITAL_COMMUNITY): Payer: Medicare Other

## 2013-07-10 DIAGNOSIS — E2749 Other adrenocortical insufficiency: Secondary | ICD-10-CM | POA: Diagnosis present

## 2013-07-10 DIAGNOSIS — E872 Acidosis, unspecified: Secondary | ICD-10-CM | POA: Diagnosis present

## 2013-07-10 DIAGNOSIS — R579 Shock, unspecified: Secondary | ICD-10-CM

## 2013-07-10 DIAGNOSIS — I498 Other specified cardiac arrhythmias: Secondary | ICD-10-CM | POA: Diagnosis present

## 2013-07-10 DIAGNOSIS — I469 Cardiac arrest, cause unspecified: Secondary | ICD-10-CM

## 2013-07-10 DIAGNOSIS — L89109 Pressure ulcer of unspecified part of back, unspecified stage: Secondary | ICD-10-CM | POA: Diagnosis present

## 2013-07-10 DIAGNOSIS — R627 Adult failure to thrive: Secondary | ICD-10-CM | POA: Diagnosis present

## 2013-07-10 DIAGNOSIS — E162 Hypoglycemia, unspecified: Secondary | ICD-10-CM | POA: Diagnosis not present

## 2013-07-10 DIAGNOSIS — F09 Unspecified mental disorder due to known physiological condition: Secondary | ICD-10-CM

## 2013-07-10 DIAGNOSIS — F079 Unspecified personality and behavioral disorder due to known physiological condition: Secondary | ICD-10-CM | POA: Diagnosis present

## 2013-07-10 DIAGNOSIS — Z8719 Personal history of other diseases of the digestive system: Secondary | ICD-10-CM

## 2013-07-10 DIAGNOSIS — R570 Cardiogenic shock: Secondary | ICD-10-CM | POA: Diagnosis present

## 2013-07-10 DIAGNOSIS — R4181 Age-related cognitive decline: Secondary | ICD-10-CM | POA: Diagnosis present

## 2013-07-10 DIAGNOSIS — I2129 ST elevation (STEMI) myocardial infarction involving other sites: Secondary | ICD-10-CM | POA: Diagnosis present

## 2013-07-10 DIAGNOSIS — L8990 Pressure ulcer of unspecified site, unspecified stage: Secondary | ICD-10-CM | POA: Diagnosis present

## 2013-07-10 DIAGNOSIS — I219 Acute myocardial infarction, unspecified: Secondary | ICD-10-CM

## 2013-07-10 DIAGNOSIS — G931 Anoxic brain damage, not elsewhere classified: Secondary | ICD-10-CM | POA: Diagnosis present

## 2013-07-10 DIAGNOSIS — E876 Hypokalemia: Secondary | ICD-10-CM | POA: Diagnosis not present

## 2013-07-10 DIAGNOSIS — J9600 Acute respiratory failure, unspecified whether with hypoxia or hypercapnia: Secondary | ICD-10-CM | POA: Diagnosis present

## 2013-07-10 DIAGNOSIS — Z7401 Bed confinement status: Secondary | ICD-10-CM

## 2013-07-10 DIAGNOSIS — G35 Multiple sclerosis: Secondary | ICD-10-CM

## 2013-07-10 DIAGNOSIS — D696 Thrombocytopenia, unspecified: Secondary | ICD-10-CM | POA: Diagnosis not present

## 2013-07-10 DIAGNOSIS — N179 Acute kidney failure, unspecified: Secondary | ICD-10-CM | POA: Diagnosis present

## 2013-07-10 DIAGNOSIS — Z66 Do not resuscitate: Secondary | ICD-10-CM | POA: Diagnosis not present

## 2013-07-10 DIAGNOSIS — Z515 Encounter for palliative care: Secondary | ICD-10-CM

## 2013-07-10 DIAGNOSIS — E870 Hyperosmolality and hypernatremia: Secondary | ICD-10-CM | POA: Diagnosis present

## 2013-07-10 DIAGNOSIS — K559 Vascular disorder of intestine, unspecified: Secondary | ICD-10-CM | POA: Diagnosis present

## 2013-07-10 DIAGNOSIS — G9340 Encephalopathy, unspecified: Secondary | ICD-10-CM | POA: Diagnosis present

## 2013-07-10 DIAGNOSIS — A419 Sepsis, unspecified organism: Secondary | ICD-10-CM | POA: Diagnosis present

## 2013-07-10 DIAGNOSIS — I2109 ST elevation (STEMI) myocardial infarction involving other coronary artery of anterior wall: Secondary | ICD-10-CM | POA: Diagnosis present

## 2013-07-10 DIAGNOSIS — J189 Pneumonia, unspecified organism: Secondary | ICD-10-CM

## 2013-07-10 DIAGNOSIS — Z9889 Other specified postprocedural states: Secondary | ICD-10-CM

## 2013-07-10 DIAGNOSIS — Z79899 Other long term (current) drug therapy: Secondary | ICD-10-CM

## 2013-07-10 DIAGNOSIS — K7200 Acute and subacute hepatic failure without coma: Secondary | ICD-10-CM | POA: Diagnosis present

## 2013-07-10 DIAGNOSIS — E43 Unspecified severe protein-calorie malnutrition: Secondary | ICD-10-CM | POA: Diagnosis present

## 2013-07-10 DIAGNOSIS — N319 Neuromuscular dysfunction of bladder, unspecified: Secondary | ICD-10-CM | POA: Diagnosis present

## 2013-07-10 DIAGNOSIS — D689 Coagulation defect, unspecified: Secondary | ICD-10-CM | POA: Diagnosis present

## 2013-07-10 DIAGNOSIS — Z681 Body mass index (BMI) 19 or less, adult: Secondary | ICD-10-CM

## 2013-07-10 LAB — CBC WITH DIFFERENTIAL/PLATELET
Basophils Absolute: 0 10*3/uL (ref 0.0–0.1)
Basophils Relative: 0 % (ref 0–1)
Eosinophils Absolute: 0 10*3/uL (ref 0.0–0.7)
HCT: 41.1 % (ref 36.0–46.0)
Hemoglobin: 12.8 g/dL (ref 12.0–15.0)
Lymphocytes Relative: 18 % (ref 12–46)
MCH: 29.4 pg (ref 26.0–34.0)
MCHC: 31.1 g/dL (ref 30.0–36.0)
Monocytes Relative: 5 % (ref 3–12)
Neutro Abs: 9.4 10*3/uL — ABNORMAL HIGH (ref 1.7–7.7)
Neutrophils Relative %: 77 % (ref 43–77)
Platelets: 170 10*3/uL (ref 150–400)
WBC: 12.2 10*3/uL — ABNORMAL HIGH (ref 4.0–10.5)

## 2013-07-10 LAB — COMPREHENSIVE METABOLIC PANEL
ALT: 1123 U/L — ABNORMAL HIGH (ref 0–35)
ALT: 1327 U/L — ABNORMAL HIGH (ref 0–35)
AST: 2568 U/L — ABNORMAL HIGH (ref 0–37)
AST: 2963 U/L — ABNORMAL HIGH (ref 0–37)
Albumin: 2 g/dL — ABNORMAL LOW (ref 3.5–5.2)
Alkaline Phosphatase: 111 U/L (ref 39–117)
Alkaline Phosphatase: 96 U/L (ref 39–117)
CO2: 7 mEq/L — CL (ref 19–32)
CO2: 13 mEq/L — ABNORMAL LOW (ref 19–32)
Chloride: 122 mEq/L — ABNORMAL HIGH (ref 96–112)
Creatinine, Ser: 2.4 mg/dL — ABNORMAL HIGH (ref 0.50–1.10)
GFR calc Af Amer: 26 mL/min — ABNORMAL LOW (ref >90)
GFR calc non Af Amer: 50 mL/min — ABNORMAL LOW (ref >90)
Glucose, Bld: 423 mg/dL — ABNORMAL HIGH (ref 70–99)
Potassium: 4.3 mEq/L (ref 3.5–5.1)
Sodium: 163 mEq/L (ref 135–145)
Sodium: 161 mEq/L (ref 135–145)
Total Bilirubin: 1.1 mg/dL (ref 0.3–1.2)
Total Protein: 7.3 g/dL (ref 6.0–8.3)
Total Protein: 5.6 g/dL — ABNORMAL LOW (ref 6.0–8.3)

## 2013-07-10 LAB — POCT I-STAT 3, ART BLOOD GAS (G3+)
Acid-base deficit: 17 mmol/L — ABNORMAL HIGH (ref 0.0–2.0)
Bicarbonate: 8.6 mEq/L — ABNORMAL LOW (ref 20.0–24.0)
Bicarbonate: 10.2 mEq/L — ABNORMAL LOW (ref 20.0–24.0)
Bicarbonate: 12.3 mEq/L — ABNORMAL LOW (ref 20.0–24.0)
O2 Saturation: 94 %
TCO2: 9 mmol/L (ref 0–100)
pCO2 arterial: 28.6 mmHg — ABNORMAL LOW (ref 35.0–45.0)
pCO2 arterial: 26.7 mmHg — ABNORMAL LOW (ref 35.0–45.0)
pCO2 arterial: 23.5 mmHg — ABNORMAL LOW (ref 35.0–45.0)
pH, Arterial: 7.103 — CL (ref 7.350–7.450)
pH, Arterial: 7.189 — CL (ref 7.350–7.450)
pH, Arterial: 7.315 — ABNORMAL LOW (ref 7.350–7.450)
pO2, Arterial: 258 mmHg — ABNORMAL HIGH (ref 80.0–100.0)
pO2, Arterial: 487 mmHg — ABNORMAL HIGH (ref 80.0–100.0)
pO2, Arterial: 69 mmHg — ABNORMAL LOW (ref 80.0–100.0)

## 2013-07-10 LAB — PROTIME-INR
INR: 3.94 — ABNORMAL HIGH (ref 0.00–1.49)
Prothrombin Time: 37 seconds — ABNORMAL HIGH (ref 11.6–15.2)

## 2013-07-10 LAB — CBC
MCH: 29.5 pg (ref 26.0–34.0)
Platelets: 132 10*3/uL — ABNORMAL LOW (ref 150–400)
RDW: 17.3 % — ABNORMAL HIGH (ref 11.5–15.5)
WBC: 27.7 10*3/uL — ABNORMAL HIGH (ref 4.0–10.5)

## 2013-07-10 LAB — BASIC METABOLIC PANEL
BUN: 40 mg/dL — ABNORMAL HIGH (ref 6–23)
Chloride: 128 mEq/L — ABNORMAL HIGH (ref 96–112)
Creatinine, Ser: 1.5 mg/dL — ABNORMAL HIGH (ref 0.50–1.10)
GFR calc Af Amer: 46 mL/min — ABNORMAL LOW (ref >90)
GFR calc non Af Amer: 40 mL/min — ABNORMAL LOW (ref >90)
Glucose, Bld: 329 mg/dL — ABNORMAL HIGH (ref 70–99)

## 2013-07-10 LAB — CORTISOL: Cortisol, Plasma: 34.3 ug/dL

## 2013-07-10 LAB — URINALYSIS, ROUTINE W REFLEX MICROSCOPIC
Glucose, UA: NEGATIVE mg/dL
Leukocytes, UA: NEGATIVE
Nitrite: NEGATIVE
pH: 5.5 (ref 5.0–8.0)

## 2013-07-10 LAB — APTT: aPTT: 61 seconds — ABNORMAL HIGH (ref 24–37)

## 2013-07-10 LAB — MAGNESIUM: Magnesium: 2.1 mg/dL (ref 1.5–2.5)

## 2013-07-10 LAB — PROCALCITONIN: Procalcitonin: 11.85 ng/mL

## 2013-07-10 LAB — URINE MICROSCOPIC-ADD ON

## 2013-07-10 LAB — STREP PNEUMONIAE URINARY ANTIGEN: Strep Pneumo Urinary Antigen: POSITIVE — AB

## 2013-07-10 LAB — LACTIC ACID, PLASMA: Lactic Acid, Venous: 7.5 mmol/L — ABNORMAL HIGH (ref 0.5–2.2)

## 2013-07-10 LAB — POCT I-STAT TROPONIN I

## 2013-07-10 LAB — PHOSPHORUS: Phosphorus: 7.1 mg/dL — ABNORMAL HIGH (ref 2.3–4.6)

## 2013-07-10 MED ORDER — CHLORHEXIDINE GLUCONATE 0.12 % MT SOLN
15.0000 mL | Freq: Two times a day (BID) | OROMUCOSAL | Status: DC
  Administered 2013-07-10 – 2013-07-17 (×15): 15 mL via OROMUCOSAL
  Filled 2013-07-10 (×18): qty 15

## 2013-07-10 MED ORDER — NOREPINEPHRINE BITARTRATE 1 MG/ML IJ SOLN
2.0000 ug/min | INTRAVENOUS | Status: DC
  Administered 2013-07-10: 30 ug/min via INTRAVENOUS
  Filled 2013-07-10: qty 4

## 2013-07-10 MED ORDER — PIPERACILLIN-TAZOBACTAM 3.375 G IVPB
3.3750 g | Freq: Once | INTRAVENOUS | Status: AC
  Administered 2013-07-10: 3.375 g via INTRAVENOUS
  Filled 2013-07-10: qty 50

## 2013-07-10 MED ORDER — EPINEPHRINE HCL 0.1 MG/ML IJ SOSY
PREFILLED_SYRINGE | INTRAMUSCULAR | Status: AC | PRN
  Administered 2013-07-10: 0.1 mg via INTRAVENOUS

## 2013-07-10 MED ORDER — SODIUM CHLORIDE 0.9 % IV SOLN
1.0000 g | Freq: Once | INTRAVENOUS | Status: AC
  Administered 2013-07-10: 1 g via INTRAVENOUS
  Filled 2013-07-10: qty 10

## 2013-07-10 MED ORDER — MIDAZOLAM HCL 5 MG/ML IJ SOLN
1.0000 mg/h | INTRAMUSCULAR | Status: DC
  Filled 2013-07-10: qty 10

## 2013-07-10 MED ORDER — POTASSIUM CHLORIDE 10 MEQ/50ML IV SOLN
10.0000 meq | INTRAVENOUS | Status: AC
  Administered 2013-07-10 (×4): 10 meq via INTRAVENOUS
  Filled 2013-07-10 (×4): qty 50

## 2013-07-10 MED ORDER — SODIUM CHLORIDE 0.9 % IV SOLN
25.0000 ug/h | INTRAVENOUS | Status: DC
  Filled 2013-07-10: qty 50

## 2013-07-10 MED ORDER — HYDROCORTISONE SOD SUCCINATE 100 MG IJ SOLR
50.0000 mg | Freq: Four times a day (QID) | INTRAMUSCULAR | Status: DC
  Administered 2013-07-10 – 2013-07-13 (×11): 50 mg via INTRAVENOUS
  Filled 2013-07-10 (×6): qty 1
  Filled 2013-07-10: qty 2
  Filled 2013-07-10 (×9): qty 1

## 2013-07-10 MED ORDER — NOREPINEPHRINE BITARTRATE 1 MG/ML IJ SOLN
2.0000 ug/min | INTRAVENOUS | Status: DC
  Administered 2013-07-10: 30 ug/min via INTRAVENOUS
  Administered 2013-07-11: 10 ug/min via INTRAVENOUS
  Filled 2013-07-10 (×2): qty 16

## 2013-07-10 MED ORDER — SODIUM BICARBONATE 8.4 % IV SOLN
INTRAVENOUS | Status: DC
  Administered 2013-07-10 – 2013-07-11 (×2): via INTRAVENOUS
  Filled 2013-07-10 (×2): qty 150

## 2013-07-10 MED ORDER — VANCOMYCIN HCL 10 G IV SOLR
1750.0000 mg | INTRAVENOUS | Status: AC
  Administered 2013-07-10: 1750 mg via INTRAVENOUS
  Filled 2013-07-10: qty 1750

## 2013-07-10 MED ORDER — SODIUM CHLORIDE 0.9 % IV SOLN
INTRAVENOUS | Status: AC | PRN
  Administered 2013-07-10: 2000 mL via INTRAVENOUS
  Administered 2013-07-10: 1000 mL via INTRAVENOUS

## 2013-07-10 MED ORDER — ACETAMINOPHEN 160 MG/5ML PO SOLN
650.0000 mg | ORAL | Status: DC | PRN
  Filled 2013-07-10: qty 20.3

## 2013-07-10 MED ORDER — SODIUM CHLORIDE 0.9 % IV SOLN
INTRAVENOUS | Status: DC
  Administered 2013-07-10: 13:00:00 via INTRAVENOUS

## 2013-07-10 MED ORDER — DOPAMINE-DEXTROSE 3.2-5 MG/ML-% IV SOLN
3.0000 ug/kg/min | INTRAVENOUS | Status: DC
  Administered 2013-07-10: 20 ug/kg/min via INTRAVENOUS
  Administered 2013-07-10: 15 ug/kg/min via INTRAVENOUS

## 2013-07-10 MED ORDER — SODIUM CHLORIDE 0.9 % IV SOLN
Freq: Once | INTRAVENOUS | Status: AC
  Administered 2013-07-10: 12:00:00 via INTRAVENOUS

## 2013-07-10 MED ORDER — PIPERACILLIN-TAZOBACTAM 3.375 G IVPB
3.3750 g | Freq: Three times a day (TID) | INTRAVENOUS | Status: DC
  Administered 2013-07-10 – 2013-07-11 (×2): 3.375 g via INTRAVENOUS
  Filled 2013-07-10 (×4): qty 50

## 2013-07-10 MED ORDER — SODIUM CHLORIDE 0.9 % IV SOLN
1.0000 ug/kg/min | INTRAVENOUS | Status: DC
  Filled 2013-07-10: qty 20

## 2013-07-10 MED ORDER — DOPAMINE-DEXTROSE 3.2-5 MG/ML-% IV SOLN
INTRAVENOUS | Status: AC | PRN
  Administered 2013-07-10: 10 ug/kg/min via INTRAVENOUS

## 2013-07-10 MED ORDER — PANTOPRAZOLE SODIUM 40 MG IV SOLR
40.0000 mg | Freq: Every day | INTRAVENOUS | Status: DC
  Administered 2013-07-10 – 2013-07-14 (×4): 40 mg via INTRAVENOUS
  Filled 2013-07-10 (×7): qty 40

## 2013-07-10 MED ORDER — SODIUM CHLORIDE 0.9 % IV SOLN
2000.0000 mL | Freq: Once | INTRAVENOUS | Status: AC
  Administered 2013-07-10: 2000 mL via INTRAVENOUS

## 2013-07-10 MED ORDER — NOREPINEPHRINE BITARTRATE 1 MG/ML IJ SOLN
0.5000 ug/min | INTRAVENOUS | Status: DC
  Filled 2013-07-10: qty 4

## 2013-07-10 MED ORDER — NOREPINEPHRINE BITARTRATE 1 MG/ML IJ SOLN
INTRAMUSCULAR | Status: AC | PRN
  Administered 2013-07-10: 20 ug/kg/min via INTRAVENOUS

## 2013-07-10 MED ORDER — ACETAMINOPHEN 650 MG RE SUPP
650.0000 mg | Freq: Once | RECTAL | Status: AC
  Administered 2013-07-10: 650 mg via RECTAL
  Filled 2013-07-10: qty 1

## 2013-07-10 MED ORDER — SODIUM BICARBONATE 8.4 % IV SOLN
50.0000 meq | Freq: Once | INTRAVENOUS | Status: AC
  Administered 2013-07-10: 50 meq via INTRAVENOUS

## 2013-07-10 MED FILL — Norepinephrine Bitartrate IV Soln 1 MG/ML (Base Equivalent): INTRAMUSCULAR | Qty: 4 | Status: AC

## 2013-07-10 NOTE — Procedures (Signed)
Arterial Catheter Insertion Procedure Note Roberta Carpenter [id number] 05-06-1961  Procedure: Insertion of Arterial Catheter  Indications: Blood pressure monitoring and Frequent blood sampling  Procedure Details Consent: Risks of procedure as well as the alternatives and risks of each were explained to the (patient/caregiver).  Consent for procedure obtained. Time Out: Verified patient identification, verified procedure, site/side was marked, verified correct patient position, special equipment/implants available, medications/allergies/relevent history reviewed, required imaging and test results available.  Performed  Maximum sterile technique was used including antiseptics, cap, gloves, gown, hand hygiene, mask and sheet. Skin prep: Chlorhexidine; local anesthetic administered 20 gauge catheter was inserted into right femoral artery using the Seldinger technique.  Evaluation Blood flow good; BP tracing good. Complications: No apparent complications.   Brett Canales Minor ACNP [facility] PCCM Pager [phone] till 3 pm If no answer page [phone]  I was present & supervised entire procedure  Dhani Imel V.  07/10/2013, 2:27 PM

## 2013-07-10 NOTE — Care Management Note (Signed)
    Page 1 of 1   07/10/2013     3:31:54 PM   CARE MANAGEMENT NOTE 07/10/2013  Patient:  Roberta Carpenter, Roberta Carpenter   Account Number:  [id number]  Date Initiated:  07/10/2013  Documentation initiated by:  Junius Creamer  Subjective/Objective Assessment:   adm w arrest, vent     Action/Plan:   lives w mother and mother, act w [facility]   Anticipated DC Date:     Anticipated DC Plan:        DC Planning Services  CM consult      [facility] Choice  Resumption Of Svcs/PTA Provider   Choice offered to / List presented to:          [facility] arranged  HH-1 RN      [facility] agency  [facility]   Status of service:   Medicare Important Message given?   (If response is "NO", the following Medicare IM given date fields will be blank) Date Medicare IM given:   Date Additional Medicare IM given:    Discharge Disposition:    Per UR Regulation:  Reviewed for med. necessity/level of care/duration of stay  If discussed at Long Length of Stay Meetings, dates discussed:    Comments:

## 2013-07-10 NOTE — ED Provider Notes (Signed)
CSN: [id number]     Arrival date & time 07/10/13  1032 History   First MD Initiated Contact with Patient 07/10/13 1045     No chief complaint on file.  (Consider location/radiation/quality/duration/timing/severity/associated sxs/prior Treatment) HPI Comments: Patient brought to ER by EMS after a witnessed arrest. Family checked on the patient and noticed that she was having trouble breathing. They report that she was gasping for air. 911 was called. Patient reportedly stopped breathing and family members started CPR. EMS arrived on the scene and confirmed that the patient was pulseless and apneic. Patient was intubated and patient was given four doses of epi via intraosseous line. They report no response to epi.  Level V Caveat due to acuity of condition.   Past Medical History  Diagnosis Date  . MS (multiple sclerosis)     [name] 05/21/2000 (06/29/2013)  . Neurogenic bladder     [name] 05/21/2000 (06/29/2013)  . 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 CAP (community acquired pneumonia)     [name] 06/28/2013 (06/29/2013)   Past Surgical History  Procedure Laterality Date  . Colon surgery    . Appendectomy    . Knee arthroscopy Right     [name] 05/21/2000 (06/29/2013)  . Abdominal exploration surgery  04/2009    w/LOA/notes 05/19/2009 06/29/2013)  . Abdominal hysterectomy  04/2009    supracervical/notes 05/19/2009 (06/29/2013)  . Exploratory laparotomy  12/2010    w/LOA/notes 01/04/2011 (06/29/2013)  . Exploratory laparotomy  12/2010    w/LOA/notes 01/04/2011 (06/29/2013)   No family history on file. History  Substance Use Topics  . Smoking status: Never Smoker   . Smokeless tobacco: Never Used  . Alcohol Use: No   OB History   Grav Para Term Preterm Abortions TAB SAB Ect Mult Living                 Review of Systems  Unable to perform ROS: Acuity of condition    Allergies  Review of patient's allergies indicates no known  allergies.  Home Medications   Current Outpatient Rx  Name  Route  Sig  Dispense  Refill  . amoxicillin-clavulanate (AUGMENTIN) 500-125 MG per tablet   Oral   Take 1 tablet (500 mg total) by mouth 3 (three) times daily.   10 tablet   0   . azithromycin (ZITHROMAX Z-PAK) 250 MG tablet   Oral   Take 1 tablet (250 mg total) by mouth daily. Take as directed   5 each   0   . baclofen (LIORESAL) 10 MG tablet   Oral   Take 10 mg by mouth 3 (three) times daily.          Marland Kitchen DM-Phenylephrine-Acetaminophen (VICKS DAYQUIL COLD & FLU) 10-5-325 MG/15ML LIQD   Oral   Take 15 mLs by mouth daily as needed.         Marland Kitchen guaiFENesin (MUCINEX CHEST CONGESTION CHILD) 100 MG/5ML liquid   Oral   Take 400 mg by mouth daily as needed for cough.         Marland Kitchen tiZANidine (ZANAFLEX) 4 MG tablet   Oral   Take 4 mg by mouth 3 (three) times daily.          There were no vitals taken for this visit. Physical Exam  Constitutional: She is intubated. Backboard in place.  HENT:  Head: Normocephalic and atraumatic.  Eyes: Pupils are equal, round, and reactive to light.  Neck: Neck supple.  Cardiovascular:  Regular rhythm.  Tachycardia present.   Pulmonary/Chest: She is intubated.  Abdominal: Soft.  Musculoskeletal: She exhibits no edema.  Neurological: She is unresponsive.  Skin: Skin is dry and intact.  Large decubitus sacral area    ED Course  CENTRAL LINE Date/Time: 07/10/2013 12:14 PM Performed by: Gilda Crease. Authorized by: Gilda Crease Consent: Verbal consent not obtained. The procedure was performed in an emergent situation. Relevant documents: relevant documents present and verified Test results: test results available and properly labeled Site marked: the operative site was marked Imaging studies: imaging studies available Patient identity confirmed: arm band and hospital-assigned identification number Time out: Immediately prior to procedure a "time out" was  called to verify the correct patient, procedure, equipment, support staff and site/side marked as required. Indications: vascular access Anesthesia: local infiltration Local anesthetic: lidocaine 1% without epinephrine Preparation: skin prepped with 2% chlorhexidine Skin prep agent dried: skin prep agent completely dried prior to procedure Sterile barriers: all five maximum sterile barriers used - cap, mask, sterile gown, sterile gloves, and large sterile sheet Hand hygiene: hand hygiene performed prior to central venous catheter insertion Location details: right internal jugular Patient position: Trendelenburg Catheter type: triple lumen Ultrasound guidance: yes Number of attempts: 2 Successful placement: yes Post-procedure: line sutured Assessment: blood return through all ports, no pneumothorax on x-ray and placement verified by x-ray   (including critical care time)  CRITICAL CARE Performed by: Gilda Crease.   Total critical care time:  Critical care time was exclusive of separately billable procedures and treating other patients.  Critical care was necessary to treat or prevent imminent or life-threatening deterioration.  Critical care was time spent personally by me on the following activities: development of treatment plan with patient and/or surrogate as well as nursing, discussions with consultants, evaluation of patient's response to treatment, examination of patient, obtaining history from patient or surrogate, ordering and performing treatments and interventions, ordering and review of laboratory studies, ordering and review of radiographic studies, pulse oximetry and re-evaluation of patient's condition.  Angiocath insertion Performed by: Gilda Crease  Consent: Verbal consent obtained. Risks and benefits: risks, benefits and alternatives were discussed Time out: Immediately prior to procedure a "time out" was called to verify the correct  patient, procedure, equipment, support staff and site/side marked as required.  Preparation: Patient was prepped and draped in the usual sterile fashion.  Vein Location: left EJ  Not Ultrasound Guided  Gauge: 18  Normal blood return and flush without difficulty Patient tolerance: Patient tolerated the procedure well with no immediate complications.  Cardiopulmonary Resuscitation (CPR) Procedure Note Directed/Performed by: Gilda Crease I personally directed ancillary staff and/or performed CPR in an effort to regain return of spontaneous circulation and to maintain cardiac, neuro and systemic perfusion.     Labs Review Labs Reviewed  CBC WITH DIFFERENTIAL - Abnormal; Notable for the following:    WBC 12.2 (*)    RDW 17.3 (*)    Neutro Abs 9.4 (*)    All other components within normal limits  COMPREHENSIVE METABOLIC PANEL - Abnormal; Notable for the following:    Sodium 163 (*)    Chloride 121 (*)    CO2 7 (*)    Glucose, Bld 423 (*)    BUN 55 (*)    Creatinine, Ser 2.40 (*)    Albumin 2.7 (*)    AST 2568 (*)    ALT 1123 (*)    GFR calc non Af Amer 22 (*)  GFR calc Af Amer 26 (*)    All other components within normal limits  POCT I-STAT TROPONIN I - Abnormal; Notable for the following:    Troponin i, poc 9.34 (*)    All other components within normal limits  POCT I-STAT 3, BLOOD GAS (G3+) - Abnormal; Notable for the following:    pH, Arterial 7.103 (*)    pCO2 arterial 28.6 (*)    pO2, Arterial 258.0 (*)    Bicarbonate 8.6 (*)    Acid-base deficit 19.0 (*)    All other components within normal limits  CULTURE, BLOOD (ROUTINE X 2)  CULTURE, BLOOD (ROUTINE X 2)   Imaging Review No results found.  EKG Interpretation   None       MDM  Diagnosis: 1. Cardiopulmonary arrest 2. Sepsis  Patient presented to the ER with CPR in progress. Patient had been found in respiratory distress by family and then stopped breathing. CPR started by bystanders and  continued by EMS. Patient was intubated and administered epinephrine. At arrival, faint pulses were present. Patient started on a Levophed drip.   EKG was grossly abnormal. Patient has diffuse ST elevations. I-STAT troponin I was elevated. Cardiology was consulted. It was felt that the patient has a significant general medical condition and is not a candidate for cardiac intervention at this time.  Patient reportedly had recently been diagnosed with pneumonia. Patient did have equal breath sounds at arrival and x-ray did not show any evidence of obvious pneumonia. Endotracheal tube appropriately placed. Patient did not have significant response to Levaquin and therefore required additional pressors dopamine initiated and a central line was placed. Placement confirmed by x-ray.  Blood gas shows metabolic acidosis. Patient's labs also show significantly elevated LFTs. This could be secondary to shock, but as there is unexplained fever at this time, primary liver/gallbladder process is entertained. She does also have a decubitus in the sacral region.  Broad-spectrum antibiotic coverage with Zosyn and vancomycin.  Critical care consult for admission.    Gilda Crease, MD 07/10/13 [phone]

## 2013-07-10 NOTE — Progress Notes (Signed)
eLink Physician-Brief Progress Note Patient Name: Roberta Carpenter DOB: December 25, 1960 MRN: [id number]  Date of Service  07/10/2013   HPI/Events of Note   Hypothermic  eICU Interventions  Bear hugger.      YACOUB,WESAM 07/10/2013, 3:20 PM

## 2013-07-10 NOTE — ED Notes (Signed)
Roberta Loll, MD (Pulmonary) at bedside

## 2013-07-10 NOTE — Code Documentation (Addendum)
Second blood culture obtained [id number] Victorino Dike, RN

## 2013-07-10 NOTE — Code Documentation (Signed)
Portable Xray called for central line verification.

## 2013-07-10 NOTE — Progress Notes (Signed)
Sent by eMD to evaluate hemodynamics.  Patient is making no neurologic progress whatsoever but the patient has severe MS and that will cloud exam.  I reviewed admitting MD's note.  Hemodynamically remains very questionable at best.  There is a cap established on pressors however and I fully agree with that.  Given over all functional status, little else should be offered here.  The pressors are near cap but fluctuating frequently.  Will not increase pressor beyond established cap and will be available if decompensates to speak with family.  Additional CC time of 35 min.  Alyson Reedy, M.D. [facility] Pulmonary/Critical Care Medicine. Pager: [phone]. After hours pager: [phone].

## 2013-07-10 NOTE — Consult Note (Addendum)
CARDIOLOGY CONSULT NOTE   Patient ID: Roberta Carpenter MRN: [id number], DOB/AGE: [birth year]   Admit date: 07/10/2013 Date of Consult: 07/10/2013  Primary Physician: Provider Not In System Primary Cardiologist: new to  - seen by [place] Circle, MD   Pt. Profile  52 y/o female w/o prior cardiac hx who presented to the ED today with hypotension, fever, ST elevation, and respiratory failure following witnessed cardiac arrest at home.  Problem List  Past Medical History  Diagnosis Date  . MS (multiple sclerosis)     Roberta Carpenter 05/21/2000 (06/29/2013)  . Neurogenic bladder     Roberta Carpenter 05/21/2000 (06/29/2013)  . 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 CAP (community acquired pneumonia)     Roberta Carpenter 06/28/2013 (06/29/2013)    Past Surgical History  Procedure Laterality Date  . Colon surgery    . Appendectomy    . Knee arthroscopy Right     Roberta Carpenter 05/21/2000 (06/29/2013)  . Abdominal exploration surgery  04/2009    w/LOA/notes 05/19/2009 06/29/2013)  . Abdominal hysterectomy  04/2009    supracervical/notes 05/19/2009 (06/29/2013)  . Exploratory laparotomy  12/2010    w/LOA/notes 01/04/2011 (06/29/2013)  . Exploratory laparotomy  12/2010    w/LOA/notes 01/04/2011 (06/29/2013)    Allergies  No Known Allergies  HPI   52 y/o female with prior h/o multiple sclerosis and organic brain syndrome who was recently discharged from [facility] following admission for aspiration pna and UTI.  At baseline, she is bedridden and requires around the clock care from her family.  She in minimally verbal.  She finished her last dose of antibiotics this past Sunday and per a family member was in her usual state of health until this AM when she was found to be unresponsive and intermittently gasping for air.  The family member checked her pulse and could not find one so she called EMS and initiated CPR.  She believes that she did CPR for about 10 mins prior to EMS arrival and at that  point the EMT's placed her on an automated CPR machine.  She continued to have no pulse of bp and was treated with 4 rounds of epinephrine per report.  Pt was intubated.  Upon arrival to the ED, ECG was performed and showed sinus tachycardia with diffuse ST elevation.  She now has a pulse but remains hypotensive with pressures in the 40's to 50's despite vasopressor therapy.  Troponin is elevated @ 9.34.  We have been asked to eval.  Inpatient Medications  Prior to Admission medications   Medication Sig Start Date End Date Taking? Authorizing Provider  amoxicillin-clavulanate (AUGMENTIN) 500-125 MG per tablet Take 1 tablet (500 mg total) by mouth 3 (three) times daily. 07/02/13   Leroy Sea, MD  azithromycin (ZITHROMAX Z-PAK) 250 MG tablet Take 1 tablet (250 mg total) by mouth daily. Take as directed 07/02/13   Leroy Sea, MD  baclofen (LIORESAL) 10 MG tablet Take 10 mg by mouth 3 (three) times daily.     Historical Provider, MD  DM-Phenylephrine-Acetaminophen (VICKS DAYQUIL COLD & FLU) 10-5-325 MG/15ML LIQD Take 15 mLs by mouth daily as needed.    Historical Provider, MD  guaiFENesin [facility] CHEST CONGESTION CHILD) 100 MG/5ML liquid Take 400 mg by mouth daily as needed for cough.    Historical Provider, MD  tiZANidine (ZANAFLEX) 4 MG tablet Take 4 mg by mouth 3 (three) times daily.    Historical Provider, MD   Family History- obtained  through previously recorded FH. Family History  Problem Relation Age of Onset  . Other      no h/o premature CAD.     Social History - obtained through previously recorded SH. History   Social History  . Marital Status: Single    Spouse Name: N/A    Number of Children: N/A  . Years of Education: N/A   Occupational History  . Not on file.   Social History Main Topics  . Smoking status: Never Smoker   . Smokeless tobacco: Never Used  . Alcohol Use: No  . Drug Use: No  . Sexual Activity: Not on file   Other Topics Concern  . Not on file     Social History Narrative   Lives in [place] with family.  She is bedridden and requires full supportive care.     Review of Systems - unable to obtain 2/2 intubated and sedated status.  Physical Exam  Blood pressure 70/48, pulse 107, temperature 104 F (40 C), temperature source Rectal, resp. rate 16, height 5\' 8"  (1.727 m), weight 219 lb 5.7 oz (99.5 kg), SpO2 98.00%.  General: intubated, unresponsive. Psych: unresponsive. Neuro: unresponsive. HEENT: Normal  Neck: Supple without bruits or JVD. Lungs:  Resp regular and unlabored, CTA. Heart: RRR no s3, s4, or murmurs. Abdomen: Soft, non-tender, non-distended, BS + x 4.  Extremities: No clubbing, cyanosis or edema. DP/PT/Radials 2+ and equal bilaterally.  Labs  Trop i, poc: 9.34 Lab Results  Component Value Date   WBC 12.2* 07/10/2013   HGB 12.8 07/10/2013   HCT 41.1 07/10/2013   MCV 94.3 07/10/2013   PLT 170 07/10/2013     Recent Labs Lab 07/10/13 1042  NA 163*  K 4.3  CL 121*  CO2 7*  BUN 55*  CREATININE 2.40*  CALCIUM 8.8  PROT 7.3  BILITOT 1.1  ALKPHOS 111  ALT 1123*  AST 2568*  GLUCOSE 423*   Radiology/Studies  Dg Chest Portable 1 View  07/10/2013   CLINICAL DATA:  ET tube placement. Multiple sclerosis. Respiratory failure.  EXAM: PORTABLE CHEST - 1 VIEW  COMPARISON:  06/28/2013.  FINDINGS: Normal heart size.  Clear lung fields.  No bony abnormality.  Endotracheal tube has been placed and lies 2.2 cm above carina. Could be pulled back slightly, 2-3 cm for optimal placement. No pneumothorax or effusion.  IMPRESSION: ET tube 2.2 cm above carina. Pullback slightly 2-3 cm for optimal placement. No active cardiopulmonary disease.   Electronically Signed   By: Davonna Belling M.D.   On: 07/10/2013 10:57   ECG  Sinus tach, 124, inferior, anterior, anterolateral ST elevation.  ASSESSMENT AND PLAN  1.  Acute shock:  Suspect sepsis in setting of high fever.  Critical care to eval.  ABX/Vasopressors per ER/CCM.     2. Acute ST elevation:  She has new, diffuse ST elevation in setting of tachycardia and elevated troponin.  In setting of fever/hypotension, presumed septic shock, and acute renal failure, it is not clear that ST changes are secondary to acute coronary occlusion or coronary hypoperfusion/demand ischemia.  Regardless, troponin is 9.34.  In her current state, she is not a candidate for diagnostic catheterization.  We will follow along.  Rec initiated of ASA/Heparin, provided that platelets are wnl.  We will consider ischemic eval pending recovery.  3.  Acute renal failure:  In setting of above.  BP support.  4.  Hypernatremia:  In setting of #3.  5.  Acute hypoxic respiratory failure/Respiratory acidosis: intubated.  Vent mgmt per CCM.  6.  Hyperglycemia:  Per CCM.  Signed, Nicolasa Ducking, NP 07/10/2013, 12:17 PM  Patient seen with NP, agree with the above note.    Patient was found unresponsive this morning by sister, unclear how long she had been in that state.  She had several rounds of epinephrine for PEA and was intubated.  Now febrile to 104 and hypotensive with profound acidosis, hypernatremia, AKI, and shock liver.  ECG showed diffuse ST elevation in the inferior, anterior, and anterolateral leads with troponin around 9.    I suspect that this is profound septic shock with resulting metabolic derangement.  She has diffuse ST elevation with elevated troponin that could represent a septic cardiomyopathy/demand ischemia in the setting of the primary process.  At this point, needs supportive care aimed at infection treatment and maintenance of blood pressure/perfusion.   Would strongly consider DNR/comfort measures at this point as I suspect the chance of meaningful recovery is low.  Any ischemic evaluation would be dependent upon recovery from the current event.   Marca Ancona 07/10/2013 12:30 PM

## 2013-07-10 NOTE — ED Notes (Signed)
Placed a temp foley into patient dark urine in return size 16 french temp foley

## 2013-07-10 NOTE — Progress Notes (Signed)
[facility]  Patient Status: Active (receiving services up to time of hospitalization)  [facility] is providing the following services: RN, ST and MSW  If patient discharges after hours, please call [phone].   Roberta Carpenter 07/10/2013, 3:52 PM

## 2013-07-10 NOTE — H&P (Addendum)
PULMONARY  / CRITICAL CARE MEDICINE  Name: SYMPHANIE CEDERBERG MRN: [id number] DOB: [birth year]    ADMISSION DATE:  07/10/2013   REFERRING MD :  EDP PRIMARY SERVICE: PCCM  CHIEF COMPLAINT:  VDRF  BRIEF PATIENT DESCRIPTION:  52 yo AAF with advanced MS, bed bound, non verbal, requiring assistance for all ADL was found 12-16 "gasping for air" by sister, who provided bystander CPR, PEA arrest with down time x 15-20 mins - febrile in ED on pressors EKG- diffuse ST elevation with elevated troponin  Recent admit 12/4-8/14 for UTI vs probable aspiration, placed on dys 1 diet  SIGNIFICANT EVENTS / STUDIES:  12-16 coded, +MI, shock liver, LCB  LINES / TUBES: 12-16 OTT>> 12-16 Rt i j cvl>> 12-16 rt fem aline>>  CULTURES: 12-16 bc x 2>> 12-16 uc>> 12-16 Sputum>>  ANTIBIOTICS: 12-16 Vanc>> 12-16 pip-tazo>>  HISTORY OF PRESENT ILLNESS:   52 yo AAF with an extensive PMH that includes MS/OBS that leaves her minimally responsive and requiring around the clock care givers. She was found 12-16 "gasping for air" by family. They started CPR and EMS was activated. Multiple rounds of EPI and CPR were required. Intubated and Rt I J cvl per EDP. Rt fem Aline placed. She has a profound metabolic acidosis and cannot tolerate respiratory rate greater than 18 without air trapping. Cardiology has seen her for troponin of 9.34 and ST changes in inferior lateral leads but has declined invasive intervention. She will not be cooled due to fever of 104, profound shock liver. We will admit to ICU, treat with broad spectrum abx, cooling blankets and antipyretics. Code status will be addressed with family , now LCB no CPR, cardioversion, pressors capped.   PAST MEDICAL HISTORY :  Past Medical History  Diagnosis Date  . MS (multiple sclerosis)     [name] 05/21/2000 (06/29/2013)  . Neurogenic bladder     [name] 05/21/2000 (06/29/2013)  . 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 CAP (community acquired pneumonia)     [name] 06/28/2013 (06/29/2013)   Past Surgical History  Procedure Laterality Date  . Colon surgery    . Appendectomy    . Knee arthroscopy Right     [name] 05/21/2000 (06/29/2013)  . Abdominal exploration surgery  04/2009    w/LOA/notes 05/19/2009 06/29/2013)  . Abdominal hysterectomy  04/2009    supracervical/notes 05/19/2009 (06/29/2013)  . Exploratory laparotomy  12/2010    w/LOA/notes 01/04/2011 (06/29/2013)  . Exploratory laparotomy  12/2010    w/LOA/notes 01/04/2011 (06/29/2013)   Prior to Admission medications   Medication Sig Start Date End Date Taking? Authorizing Provider  amoxicillin-clavulanate (AUGMENTIN) 500-125 MG per tablet Take 1 tablet (500 mg total) by mouth 3 (three) times daily. 07/02/13   Leroy Sea, MD  azithromycin (ZITHROMAX Z-PAK) 250 MG tablet Take 1 tablet (250 mg total) by mouth daily. Take as directed 07/02/13   Leroy Sea, MD  baclofen (LIORESAL) 10 MG tablet Take 10 mg by mouth 3 (three) times daily.     Historical Provider, MD  DM-Phenylephrine-Acetaminophen (VICKS DAYQUIL COLD & FLU) 10-5-325 MG/15ML LIQD Take 15 mLs by mouth daily as needed.    Historical Provider, MD  guaiFENesin [facility] CHEST CONGESTION CHILD) 100 MG/5ML liquid Take 400 mg by mouth daily as needed for cough.    Historical Provider, MD  tiZANidine (ZANAFLEX) 4 MG tablet Take 4 mg by mouth 3 (three) times daily.    Historical Provider, MD  No Known Allergies  FAMILY HISTORY:  Family History  Problem Relation Age of Onset  . Other      no h/o premature CAD.   SOCIAL HISTORY:  reports that she has never smoked. She has never used smokeless tobacco. She reports that she does not drink alcohol or use illicit drugs.  REVIEW OF SYSTEMS:  Unable to obtain  SUBJECTIVE:   VITAL SIGNS: Temp:  [104 F (40 C)] 104 F (40 C) (12/16 1047) Pulse Rate:  [107-145] 107 (12/16 1215) Resp:  [14-26] 16 (12/16 1215) BP: (45-128)/(11-111) 70/48  mmHg (12/16 1215) SpO2:  [64 %-100 %] 98 % (12/16 1215) Arterial Line BP: (57)/(22) 57/22 mmHg (12/16 1215) FiO2 (%):  [100 %] 100 % (12/16 1047) Weight:  [219 lb 5.7 oz (99.5 kg)] 219 lb 5.7 oz (99.5 kg) (12/16 1045) HEMODYNAMICS:   VENTILATOR SETTINGS: Vent Mode:  [-] PRVC FiO2 (%):  [100 %] 100 % Set Rate:  [20 bmp] 20 bmp Vt Set:  [550 mL] 550 mL PEEP:  [5 cmH20] 5 cmH20 Plateau Pressure:  [22 cmH20] 22 cmH20 INTAKE / OUTPUT: Intake/Output     12/15 0701 - 12/16 0700 12/16 0701 - 12/17 0700   I.V. (mL/kg)  2000 (20.1)   Total Intake(mL/kg)  2000 (20.1)   Net   +2000          PHYSICAL EXAMINATION: General: Frail AAF, chronically ill woman, intubated Neuro:  Unresponsive, NO follows commands HEENT:  Pupils 6 mm. Cardiovascular:  HSD Lungs:  Coarse rhonchi Abdomen:  Soft , decreased bs Musculoskeletal:  Lower ext foot drop Skin:  cool  LABS:  CBC  Recent Labs Lab 07/10/13 1042  WBC 12.2*  HGB 12.8  HCT 41.1  PLT 170   Coag's No results found for this basename: APTT, INR,  in the last 168 hours BMET  Recent Labs Lab 07/10/13 1042  NA 163*  K 4.3  CL 121*  CO2 7*  BUN 55*  CREATININE 2.40*  GLUCOSE 423*   Electrolytes  Recent Labs Lab 07/10/13 1042  CALCIUM 8.8   Sepsis Markers No results found for this basename: LATICACIDVEN, PROCALCITON, O2SATVEN,  in the last 168 hours ABG  Recent Labs Lab 07/10/13 1105 07/10/13 1223  PHART 7.103* 7.189*  PCO2ART 28.6* 26.7*  PO2ART 258.0* 487.0*   Liver Enzymes  Recent Labs Lab 07/10/13 1042  AST 2568*  ALT 1123*  ALKPHOS 111  BILITOT 1.1  ALBUMIN 2.7*   Cardiac Enzymes No results found for this basename: TROPONINI, PROBNP,  in the last 168 hours Glucose No results found for this basename: GLUCAP,  in the last 168 hours  Imaging Dg Chest Portable 1 View  07/10/2013   CLINICAL DATA:  ET tube placement  EXAM: PORTABLE CHEST - 1 VIEW  COMPARISON:  07/10/2013  FINDINGS: Cardiomediastinal  silhouette is stable. Endotracheal tube in place. NG tube in mid esophagus. The NG tube should be advanced into the stomach. No diagnostic pneumothorax. Right IJ central with tip in SVC.  IMPRESSION: Endotracheal tube in place. NG tube with tip in mid esophagus. The NG tube should be advanced into the stomach. No diagnostic pneumothorax.  These results were called by telephone at the time of interpretation on 07/10/2013 at 12:12 PM to Dr. Jaci Carrel , who verbally acknowledged these results.   Electronically Signed   By: Natasha Mead M.D.   On: 07/10/2013 12:12   Dg Chest Portable 1 View  07/10/2013   CLINICAL DATA:  ET tube  placement. Multiple sclerosis. Respiratory failure.  EXAM: PORTABLE CHEST - 1 VIEW  COMPARISON:  06/28/2013.  FINDINGS: Normal heart size.  Clear lung fields.  No bony abnormality.  Endotracheal tube has been placed and lies 2.2 cm above carina. Could be pulled back slightly, 2-3 cm for optimal placement. No pneumothorax or effusion.  IMPRESSION: ET tube 2.2 cm above carina. Pullback slightly 2-3 cm for optimal placement. No active cardiopulmonary disease.   Electronically Signed   By: Davonna Belling M.D.   On: 07/10/2013 10:57     CXR: See above  ASSESSMENT / PLAN:  PULMONARY A: VDRF secondary to shock. Air traps at rate >18 P:   Vent bundle Keep RR at 18  CARDIOVASCULAR A: STEMI/ Shock(combination cardiogenic/sepsis/possible adrenal insuff.  P:  Treat as sepsis No heparin since high INR Pressors capped -levophed @ 30 mcg & dopamine @20  mcg LCB no cpr/cardioversion   RENAL Lab Results  Component Value Date   CREATININE 2.40* 07/10/2013   CREATININE 0.34* 06/30/2013   CREATININE 0.34* 06/29/2013    A:  ACute renal failure Hypernatremia P:   Hydration Renal US Bicarb gtt- start with 3 amps in d5w, once acidosis corrected >7.25 , change to 2 amps (hypotonic fluid)  GASTROINTESTINAL A:  GI protection P:   PPI  HEMATOLOGIC A:  Shock liver P:   Monitor  INFECTIOUS A: Recent CAP/UTI P:   V/Z for broad spectrum coverage 12-16  ENDOCRINE A:  No acute issue P:     NEUROLOGIC A:  AMS/OBS P:   CT head when stable  TODAY'S SUMMARY: 52 yo AAF with an extensive PMH that includes MS/OBS that leaves her minimally responsive and requiring around the clock care givers. She was found 12-16 "gasping for air" by family. They started CPR and EMS was activated. Multiple rounds of EPI and CPR were required. Intubated and Rt I J cvl per EDP. Rt fem Aline placed. She has a profound metabolic acidosis and cannot tolerate respiratory rate greater than 18 without air trapping. Cardiology has seen her for troponin of 9.34 and ST changes in inferior lateral leads but has declined invasive intervention. She will not be cooled due to fever of 104, profound shock liver. We will admit to ICU, treat with broad spectrum abx, cooling blankets and antipyretics. Code status addressed with family , now LCB no CPR, cardioversion, pressors capped.    Brett Canales Minor ACNP [facility] PCCM Pager [phone] till 3 pm If no answer page [phone]  Independently examined pt, evaluated data & formulated above care plan with NP who scribed this note & edited by me.  Care during the described time interval was provided by me and/or other providers on the critical care team.  I have reviewed this patient's available data, including medical history, events of note, physical examination and test results as part of my evaluation  CC time x  87m  Cyril Mourning MD. FCCP. [facility] Pager [phone] If no response call [phone]   07/10/2013, 12:29 PM

## 2013-07-10 NOTE — Progress Notes (Signed)
CRITICAL VALUE ALERT  Critical value received: K 2.3  Co2 10     Ca 4.7  Date of notification:  07/10/13  Time of notification: 1800  Critical value read back:yes  Nurse who received alert:  P. Mikle Bosworth RN  MD notified (1st page): Dr. Molli Knock  Time of first page:  1805  MD notified (2nd page):  Time of second page:  Responding MD:  Dr. Molli Knock  Time MD responded:  [phone]

## 2013-07-10 NOTE — Code Documentation (Signed)
Family updated as to patient's status. Chaplain spoke with Verlon Au, RN and updated on patient's condition. Informed as soon as  CC is available they will update the family. Chaplain explained procedures still in place and will update family of condition.

## 2013-07-10 NOTE — Code Documentation (Signed)
Cardiology arrived

## 2013-07-10 NOTE — Code Documentation (Signed)
Pt arrived via EMS from home. Pt had witnessed arrest by family. Family initiated CPR. EMS arrived and pt was in PEA. Pt received 4 Epi and 1 amp of D50.  Capnography was 30 with EMS.  Pt hx includes Multiple Sclerosis. Pt recently had pneumonia and was hospitalized.

## 2013-07-10 NOTE — Code Documentation (Signed)
Pt came in intubated by EMS. Tube was 7 mm and 26 at the lips.

## 2013-07-10 NOTE — Code Documentation (Signed)
Cardiology and Critical Care called

## 2013-07-10 NOTE — Code Documentation (Signed)
Central line being started by Blinda Leatherwood, MD.

## 2013-07-10 NOTE — Progress Notes (Signed)
CRITICAL VALUE ALERT  Critical value received: Na 161 and Ca 6.4  Date of notification:  07/10/13  Time of notification:  1530  Critical value read back:yes  Nurse who received alert:  P. Rance Muir  MD notified (1st page):  Dr. Molli Knock  Time of first page:  1630  MD notified (2nd page):  Time of second page:  Responding MD:  Dr. Molli Knock  Time MD responded:  1700

## 2013-07-10 NOTE — Code Documentation (Signed)
Portable Xray Called.

## 2013-07-10 NOTE — Progress Notes (Signed)
Patient came in as CPR.  Family met with doctor to discuss patient status and continued care.  Family was later escorted to [facility] waiting area.  Provided emotional, spiritual support to family and promoted information sharing between family and staff.  Pt going to 2h10.   Will pass on to unit Chaplain for continued support.  07/10/13 1400  Clinical Encounter Type  Visited With Patient;Family;Health care provider  Visit Type Spiritual support;Critical Care;ED;Trauma  Referral From Nurse  Spiritual Encounters  Spiritual Needs Emotional  ..Marland KitchenMarland KitchenVenida Jarvis, [place], Pager [phone]

## 2013-07-10 NOTE — Code Documentation (Signed)
Critical Care arrived

## 2013-07-10 NOTE — Progress Notes (Addendum)
ANTIBIOTIC CONSULT NOTE - INITIAL  Pharmacy Consult for Vancomycin + Zosyn Indication: r/o PNA/sepsis  No Known Allergies  Patient Measurements: Height: 5\' 8"  (172.7 cm) Weight: 219 lb 5.7 oz (99.5 kg) IBW/kg (Calculated) : 63.9  Vital Signs: Temp: 104 F (40 C) (12/16 1047) Temp src: Rectal (12/16 1047) BP: 77/50 mmHg (12/16 1230) Pulse Rate: 107 (12/16 1245) Intake/Output from previous day:   Intake/Output from this shift: Total I/O In: 2000 [I.V.:2000] Out: -   Labs:  Recent Labs  07/10/13 1042  WBC 12.2*  HGB 12.8  PLT 170  CREATININE 2.40*   Estimated Creatinine Clearance: 34.6 ml/min (by C-G formula based on Cr of 2.4). No results found for this basename: VANCOTROUGH, VANCOPEAK, VANCORANDOM, GENTTROUGH, GENTPEAK, GENTRANDOM, TOBRATROUGH, TOBRAPEAK, TOBRARND, AMIKACINPEAK, AMIKACINTROU, AMIKACIN,  in the last 72 hours   Microbiology: Recent Results (from the past 720 hour(s))  CULTURE, BLOOD (ROUTINE X 2)     Status: None   Collection Time    06/28/13  2:30 PM      Result Value Range Status   Specimen Description BLOOD RIGHT FOREARM   Final   Special Requests     Final   Value: BOTTLES DRAWN AEROBIC AND ANAEROBIC 10CCBLUE 5CCRED   Culture  Setup Time     Final   Value: 06/28/2013 21:27     Performed at [facility]   Culture     Final   Value: STAPHYLOCOCCUS SPECIES (COAGULASE NEGATIVE)     Note: THE SIGNIFICANCE OF ISOLATING THIS ORGANISM FROM A SINGLE SET OF BLOOD CULTURES WHEN MULTIPLE SETS ARE DRAWN IS UNCERTAIN. PLEASE NOTIFY THE MICROBIOLOGY DEPARTMENT WITHIN ONE WEEK IF SPECIATION AND SENSITIVITIES ARE REQUIRED.     Note: Gram Stain Report Called to,Read Back By and Verified With: BRITTANY B @ 1245 06/29/13 BY KRAWS     Performed at [facility]   Report Status 06/30/2013 FINAL   Final  URINE CULTURE     Status: None   Collection Time    06/28/13  3:44 PM      Result Value Range Status   Specimen Description URINE, CATHETERIZED    Final   Special Requests NONE   Final   Culture  Setup Time     Final   Value: 06/28/2013 16:13     Performed at [facility]   Colony Count     Final   Value: 40,000 COLONIES/ML     Performed at [facility]   Culture     Final   Value: ESCHERICHIA COLI     Performed at [facility]   Report Status 06/30/2013 FINAL   Final   Organism ID, Bacteria ESCHERICHIA COLI   Final  CULTURE, BLOOD (ROUTINE X 2)     Status: None   Collection Time    06/28/13  4:50 PM      Result Value Range Status   Specimen Description BLOOD HAND RIGHT   Final   Special Requests BOTTLES DRAWN AEROBIC ONLY 4CC   Final   Culture  Setup Time     Final   Value: 06/29/2013 00:07     Performed at [facility]   Culture     Final   Value: NO GROWTH 5 DAYS     Performed at [facility]   Report Status 07/05/2013 FINAL   Final    Medical History: Past Medical History  Diagnosis Date  . MS (multiple sclerosis)     Roberta Carpenter 05/21/2000 (  06/29/2013)  . Neurogenic bladder     Roberta Carpenter 05/21/2000 (06/29/2013)  . 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 CAP (community acquired pneumonia)     Roberta Carpenter 06/28/2013 (06/29/2013)    Assessment: 52 y.o. F with hx MS who was brought into the [facility] after a witnessed arrest. The patient has recently been diagnosed with PNA. Pharmacy was consulted to start Vancomycin + Zosyn for empiric PNA and r/o sepsis coverage. The patient has yet to receive any Vancomycin in the ED -- Zosyn was given around 1245. The patient is noted to be in ARI, baseline SCr 0.3-0.5 -- current SCr 2.4, CrCl hard to estimate. Will plan to load and then obtain a random level tomorrow morning to assist additional doses and ensure clearing.  Goal of Therapy:  Vancomycin trough level 15-20 mcg/ml  Plan:  1. Vancomycin 1750 mg IV x 1 dose to load 2. Obtain a random Vancomycin level on 12/17 morning to ensure clearance and help with  additional dosing 3. Zosyn 3.375g IV every 8 hours (infused over 4 hours) 4. Will continue to follow renal function, culture results, LOT, and antibiotic de-escalation plans   Georgina Pillion, PharmD, BCPS Clinical Pharmacist Pager: [phone] 07/10/2013 1:20 PM

## 2013-07-10 NOTE — Code Documentation (Signed)
Temp Foley placed by [employer]

## 2013-07-11 ENCOUNTER — Inpatient Hospital Stay (HOSPITAL_COMMUNITY): Payer: Medicare Other

## 2013-07-11 DIAGNOSIS — I469 Cardiac arrest, cause unspecified: Secondary | ICD-10-CM

## 2013-07-11 DIAGNOSIS — J189 Pneumonia, unspecified organism: Secondary | ICD-10-CM

## 2013-07-11 DIAGNOSIS — I319 Disease of pericardium, unspecified: Secondary | ICD-10-CM

## 2013-07-11 DIAGNOSIS — R579 Shock, unspecified: Secondary | ICD-10-CM

## 2013-07-11 LAB — BLOOD GAS, ARTERIAL
Acid-base deficit: 8.5 mmol/L — ABNORMAL HIGH (ref 0.0–2.0)
Bicarbonate: 15.3 mEq/L — ABNORMAL LOW (ref 20.0–24.0)
FIO2: 0.4 %
MECHVT: 380 mL
TCO2: 16 mmol/L (ref 0–100)
pCO2 arterial: 24.4 mmHg — ABNORMAL LOW (ref 35.0–45.0)
pH, Arterial: 7.412 (ref 7.350–7.450)
pO2, Arterial: 150 mmHg — ABNORMAL HIGH (ref 80.0–100.0)

## 2013-07-11 LAB — DIC (DISSEMINATED INTRAVASCULAR COAGULATION) PANEL
Fibrinogen: 192 mg/dL — ABNORMAL LOW (ref 204–475)
INR: 3.04 — ABNORMAL HIGH (ref 0.00–1.49)
Platelets: 61 10*3/uL — ABNORMAL LOW (ref 150–400)

## 2013-07-11 LAB — BASIC METABOLIC PANEL
Calcium: 6.4 mg/dL — CL (ref 8.4–10.5)
Chloride: 122 mEq/L — ABNORMAL HIGH (ref 96–112)
Creatinine, Ser: 2.52 mg/dL — ABNORMAL HIGH (ref 0.50–1.10)
GFR calc Af Amer: 24 mL/min — ABNORMAL LOW (ref >90)
GFR calc non Af Amer: 21 mL/min — ABNORMAL LOW (ref >90)
Sodium: 159 mEq/L — ABNORMAL HIGH (ref 135–145)

## 2013-07-11 LAB — GLUCOSE, CAPILLARY
Glucose-Capillary: 99 mg/dL (ref 70–99)
Glucose-Capillary: 135 mg/dL — ABNORMAL HIGH (ref 70–99)
Glucose-Capillary: 83 mg/dL (ref 70–99)
Glucose-Capillary: 78 mg/dL (ref 70–99)

## 2013-07-11 LAB — DIC (DISSEMINATED INTRAVASCULAR COAGULATION)PANEL
D-Dimer, Quant: >20 ug/mL-FEU — ABNORMAL HIGH (ref 0.00–0.48)
Prothrombin Time: 30.4 seconds — ABNORMAL HIGH (ref 11.6–15.2)
Smear Review: NONE SEEN

## 2013-07-11 LAB — VANCOMYCIN, RANDOM: Vancomycin Rm: 36.7 ug/mL

## 2013-07-11 LAB — CBC
MCHC: 32.9 g/dL (ref 30.0–36.0)
MCV: 89.1 fL (ref 78.0–100.0)
Platelets: 81 10*3/uL — ABNORMAL LOW (ref 150–400)
RDW: 16.7 % — ABNORMAL HIGH (ref 11.5–15.5)
WBC: 29.2 10*3/uL — ABNORMAL HIGH (ref 4.0–10.5)

## 2013-07-11 LAB — LACTIC ACID, PLASMA: Lactic Acid, Venous: 4.3 mmol/L — ABNORMAL HIGH (ref 0.5–2.2)

## 2013-07-11 LAB — LEGIONELLA ANTIGEN, URINE

## 2013-07-11 MED ORDER — POTASSIUM CHLORIDE 10 MEQ/50ML IV SOLN
10.0000 meq | INTRAVENOUS | Status: AC
  Administered 2013-07-11 (×4): 10 meq via INTRAVENOUS
  Filled 2013-07-11 (×4): qty 50

## 2013-07-11 MED ORDER — HEPARIN (PORCINE) IN NACL 100-0.45 UNIT/ML-% IJ SOLN
800.0000 [IU]/h | INTRAMUSCULAR | Status: DC
  Administered 2013-07-11: 800 [IU]/h via INTRAVENOUS
  Filled 2013-07-11: qty 250

## 2013-07-11 MED ORDER — PIPERACILLIN-TAZOBACTAM IN DEX 2-0.25 GM/50ML IV SOLN
2.2500 g | Freq: Four times a day (QID) | INTRAVENOUS | Status: DC
  Administered 2013-07-11 – 2013-07-14 (×12): 2.25 g via INTRAVENOUS
  Filled 2013-07-11 (×16): qty 50

## 2013-07-11 MED ORDER — INSULIN ASPART 100 UNIT/ML ~~LOC~~ SOLN
0.0000 [IU] | SUBCUTANEOUS | Status: DC
  Administered 2013-07-12 (×3): 2 [IU] via SUBCUTANEOUS

## 2013-07-11 MED ORDER — SODIUM BICARBONATE 8.4 % IV SOLN
INTRAVENOUS | Status: DC
  Administered 2013-07-11 (×3): via INTRAVENOUS
  Filled 2013-07-11 (×7): qty 100

## 2013-07-11 MED ORDER — ASPIRIN 325 MG PO TABS
325.0000 mg | ORAL_TABLET | Freq: Every day | ORAL | Status: DC
  Administered 2013-07-11 – 2013-07-13 (×3): 325 mg via ORAL
  Filled 2013-07-11 (×6): qty 1

## 2013-07-11 NOTE — Progress Notes (Addendum)
INITIAL NUTRITION ASSESSMENT  DOCUMENTATION CODES Per approved criteria  -Not Applicable   INTERVENTION: 1.  Enteral nutrition; initiate Vital 1.2 @ 10 mL/hr continuous.  Advance by 10 mL q 8 hrs to 45 mL/hr goal to provide 1296 kcal, 81g protein, 875 mL free water. 2.  Nutrition-related labs; recommend monitoring K, Mg, and Phos x3 days after TFs initiated in pt with poor PO PTA and meets criteria for severe malnutrition of acute illness  NUTRITION DIAGNOSIS: Inadequate oral intake related to inability to eat as evidenced by intubated, vent.   Monitor:  1.  Enteral nutrition; initiation with tolerance.  Pt to meet >/=90% estimated needs with nutrition support.  2.  Wt/wt change; monitor trends  Reason for Assessment: vent  52 y.o. female  Admitting Dx: respiratory distress  ASSESSMENT: Pt admitted with respiratory distress and cardiac arrest.  Pt with h/o MS.  Patient is currently intubated on ventilator support.  MV: 9.3 L/min Temp (24hrs), Avg:97.5 F (36.4 C), Min:94.1 F (34.5 C), Max:98.6 F (37 C)  Propofol: none  Sister at bedside states that pt typically eats a soft diet with thin liquids at home.  She states that approximately 1 week ago, her [facility] RN recommended transition to thickened liquids, but sister is unable to state what consistency- "was just adding some."  Per RN, with difficulty obtaining OGT.  Pt now with NGT/feeding tube.   Nutrition Focused Physical Exam: Subcutaneous Fat:  Orbital Region: mild-moderate wasting Upper Arm Region: WNL Thoracic and Lumbar Region: WNL  Muscle:  Temple Region: mild-moderate wasting Clavicle Bone Region: mild-moderate wasting Clavicle and Acromion Bone Region: mild wasting Scapular Bone Region: WNL Dorsal Hand: WNL Patellar Region: severe wasting Anterior Thigh Region: severe wasting Posterior Calf Region: severe wasting  Edema: none present  Sister states that pt typically eats well and has maintained her  usual wt of 124-125 lbs.  She states that over the past 1-2 weeks, pt has been eating less.  Taking only a few bites at meals.  RD notes admission wt of 105 lbs (84% usual wt).   Pt meets criteria for severe MALNUTRITION in the context of acute illness as evidenced by 16%wt loss in <1 month, poor PO intake not meeting 50% of estimated needs for >2 weeks.  Height: Ht Readings from Last 1 Encounters:  07/10/13 5\' 8"  (1.727 m)    Weight: Wt Readings from Last 1 Encounters:  07/11/13 105 lb 13.1 oz (48 kg)    Ideal Body Weight: 140 lbs  % Ideal Body Weight: 75%  Wt Readings from Last 10 Encounters:  07/11/13 105 lb 13.1 oz (48 kg)  06/29/13 219 lb 5.7 oz (99.5 kg)  01/19/11 120 lb 12.8 oz (54.795 kg)    Usual Body Weight: 125 lbs per sister  % Usual Body Weight: 84%  BMI:  Body mass index is 16.09 kg/(m^2).  Estimated Nutritional Needs: Kcal: 1354 Protein: 48-58g Fluid: ~1.5 L/day  Skin: intact  Diet Order: NPO  EDUCATION NEEDS: -Education not appropriate at this time   Intake/Output Summary (Last 24 hours) at 07/11/13 1225 Last data filed at 07/11/13 0938  Gross per 24 hour  Intake 5700.76 ml  Output    200 ml  Net 5500.76 ml    Last BM: PTA   Labs:   Recent Labs Lab 07/10/13 1042 07/10/13 1408 07/10/13 1710 07/11/13 0415  NA 163* 161* 156* 159*  K 4.3 3.4* 2.3* 2.9*  CL 121* 122* 128* 122*  CO2 7* 13* 10* 15*  BUN 55* 48* 40* 56*  CREATININE 2.40* 1.24* 1.50* 2.52*  CALCIUM 8.8 6.4* 4.7* 6.4*  MG  --  2.1  --   --   PHOS  --  7.1*  --   --   GLUCOSE 423* 342* 329* 180*    CBG (last 3)   Recent Labs  07/11/13 0728  GLUCAP 99    Scheduled Meds: . chlorhexidine  15 mL Mouth/Throat BID  . hydrocortisone sod succinate (SOLU-CORTEF) inj  50 mg Intravenous Q6H  . insulin aspart  0-15 Units Subcutaneous Q4H  . pantoprazole (PROTONIX) IV  40 mg Intravenous QHS  . piperacillin-tazobactam (ZOSYN)  IV  2.25 g Intravenous Q6H    Continuous  Infusions: . fentaNYL infusion INTRAVENOUS    . norepinephrine (LEVOPHED) Adult infusion 8 mcg/min (07/11/13 0545)  .  sodium bicarbonate  infusion 1000 mL 125 mL/hr at 07/11/13 0559    Past Medical History  Diagnosis Date  . MS (multiple sclerosis)     [name] 05/21/2000 (06/29/2013)  . Neurogenic bladder     [name] 05/21/2000 (06/29/2013)  . 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 CAP (community acquired pneumonia)     [name] 06/28/2013 (06/29/2013)    Past Surgical History  Procedure Laterality Date  . Colon surgery    . Appendectomy    . Knee arthroscopy Right     [name] 05/21/2000 (06/29/2013)  . Abdominal exploration surgery  04/2009    w/LOA/notes 05/19/2009 06/29/2013)  . Abdominal hysterectomy  04/2009    supracervical/notes 05/19/2009 (06/29/2013)  . Exploratory laparotomy  12/2010    w/LOA/notes 01/04/2011 (06/29/2013)  . Exploratory laparotomy  12/2010    w/LOA/notes 01/04/2011 (06/29/2013)    Loyce Dys, MS RD LDN Clinical Inpatient Dietitian Pager: [phone] Weekend/After hours pager: [phone]

## 2013-07-11 NOTE — Significant Event (Signed)
Multiple electrolyte disorders - HypoK+, hypernatremia, acidosis Pt on NS infusion and HCO3 infusion  IVFs adjusted $ runs KCl  Billy Fischer, MD ; [facility] [phone].  After 5:30 PM or weekends, call [phone]

## 2013-07-11 NOTE — Progress Notes (Signed)
Dr Marin Shutter notified that patient has had no urine output in last 2 hours.

## 2013-07-11 NOTE — Progress Notes (Signed)
PULMONARY  / CRITICAL CARE MEDICINE  Name: Roberta Carpenter MRN: [id number] DOB: 04-02-1961    ADMISSION DATE:  07/10/2013   REFERRING MD :  EDP PRIMARY SERVICE: PCCM  CHIEF COMPLAINT:  Acute respiratory failure  BRIEF PATIENT DESCRIPTION: 52 yo with advanced MS, bed bound, non verbal, requiring assistance for all ADL was found 12/16 "gasping for air" by sister, who provided bystander CPR, PEA arrest with down time x 15-20 mins.  SIGNIFICANT EVENTS / STUDIES:  12/16  Admitted after cardiac arrest, made limited code 12/16  Head CT >>> nad, changes consistent with MS  LINES / TUBES: OTT 12/16 >>> R IJ CVL 12/16 >>> R fem A-line 12/16 >>> Foley 12/16 >>>  CULTURES: 12/16 MRSA PCR >>> neg 12/16 Blood >>> 12/16 Urine >>>  ANTIBIOTICS: Zosyn 12/16 >>> Vancomycin 12/16 >>>  SUBJECTIVE: No events overnight.  VITAL SIGNS: Temp:  [94.1 F (34.5 C)-98.6 F (37 C)] 98.6 F (37 C) (12/17 1142) Pulse Rate:  [73-115] 103 (12/17 1142) Resp:  [14-27] 24 (12/17 1142) BP: (43-146)/(28-99) 125/78 mmHg (12/17 1142) SpO2:  [35 %-100 %] 99 % (12/17 1142) Arterial Line BP: (57-164)/(22-93) 128/79 mmHg (12/17 1100) FiO2 (%):  [40 %-60 %] 40 % (12/17 1142) Weight:  [48 kg (105 lb 13.1 oz)] 48 kg (105 lb 13.1 oz) (12/17 0413) HEMODYNAMICS:   VENTILATOR SETTINGS: Vent Mode:  [-] CPAP FiO2 (%):  [40 %-60 %] 40 % Set Rate:  [18 bmp-22 bmp] 22 bmp Vt Set:  [380 mL-440 mL] 380 mL PEEP:  [5 cmH20] 5 cmH20 Pressure Support:  [5 cmH20] 5 cmH20 Plateau Pressure:  [13 cmH20-21 cmH20] 15 cmH20 INTAKE / OUTPUT: Intake/Output     12/16 0701 - 12/17 0700 12/17 0701 - 12/18 0700   I.V. (mL/kg) 6255.8 (130.3) 390 (8.1)   IV Piggyback 955 100   Total Intake(mL/kg) 7210.8 (150.2) 490 (10.2)   Urine (mL/kg/hr) 200    Total Output 200     Net +7010.8 +490          PHYSICAL EXAMINATION: General:  Appears acutely ill, mechanically ventilated, synchronous Neuro:  Encephalopathic, nonfocal, cough  / gag diminished HEENT:  PERRL, OETT / OGT Cardiovascular:  RRR, no m/r/g Lungs:  Bilateral diminished air entry, scattered rhonchi Abdomen:  Soft, nontender, bowel sounds diminished Musculoskeletal:  Bilateral foot drop, no edema Skin:  Intact  LABS:  CBC  Recent Labs Lab 07/10/13 1042 07/10/13 1408 07/11/13 0415  WBC 12.2* 27.7* 29.2*  HGB 12.8 11.1* 11.6*  HCT 41.1 35.3* 35.3*  PLT 170 132* 81*   Coag's  Recent Labs Lab 07/10/13 1408  APTT 61*  INR 3.94*   BMET  Recent Labs Lab 07/10/13 1408 07/10/13 1710 07/11/13 0415  NA 161* 156* 159*  K 3.4* 2.3* 2.9*  CL 122* 128* 122*  CO2 13* 10* 15*  BUN 48* 40* 56*  CREATININE 1.24* 1.50* 2.52*  GLUCOSE 342* 329* 180*   Electrolytes  Recent Labs Lab 07/10/13 1408 07/10/13 1710 07/11/13 0415  CALCIUM 6.4* 4.7* 6.4*  MG 2.1  --   --   PHOS 7.1*  --   --    Sepsis Markers  Recent Labs Lab 07/10/13 1255 07/10/13 1408  LATICACIDVEN 7.5* 8.7*  PROCALCITON 11.85  --    ABG  Recent Labs Lab 07/10/13 1223 07/10/13 1728 07/11/13 0454  PHART 7.189* 7.315* 7.412  PCO2ART 26.7* 23.5* 24.4*  PO2ART 487.0* 69.0* 150.0*   Liver Enzymes  Recent Labs Lab 07/10/13 1042 07/10/13 1408  AST 2568* 2963*  ALT 1123* 1327*  ALKPHOS 111 96  BILITOT 1.1 1.1  ALBUMIN 2.7* 2.0*   Cardiac Enzymes No results found for this basename: TROPONINI, PROBNP,  in the last 168 hours Glucose  Recent Labs Lab 07/11/13 0728  GLUCAP 99   CXR: None today  ASSESSMENT / PLAN:  PULMONARY A: Acute respiratory failure in setting of cardiac arrest. P:   Goal pH>7.30, SpO2>92 Continuous mechanical support VAP bundle Daily SBT Trend ABG/CXR  CARDIOVASCULAR A: STEMI. Shock (septic vs cardiogenic). P:  Goal MAP>65 D/c Dopamine Levophed gtt No CPR / no defibrillation  Trend Troponin / Lactate  RENAL A:  Acute renal failure in setting of shock. Hypernatremia. Hypokalemia. Acidosis. P:   Bicarbonate  gtt@125  K 10 x 4  GASTROINTESTINAL A:  No active issues. P:   NPO Protonix for GI Px May need IR to place FT as several attempts failed Trend LFT  HEMATOLOGIC A: Thrombocytopenia / coagulopathy. No overt hemorrhage. P:  SCDs for DVT Px DIC panel  INFECTIOUS A: No clear infection source.  P:   Cx / abx as above  ENDOCRINE A:  Hyperglycemia.  Presumed adrenal insufficiency. P:   Hydrocortisone 50 q6h  Start SSI  NEUROLOGIC A:  Acute encephalopathy.  Advanced multiple sclerosis. P:   Fentanyl gtt  I have personally obtained history, examined patient, evaluated and interpreted laboratory and imaging results, reviewed medical records, formulated assessment / plan and placed orders.  CRITICAL CARE:  The patient is critically ill with multiple organ systems failure and requires high complexity decision making for assessment and support, frequent evaluation and titration of therapies, application of advanced monitoring technologies and extensive interpretation of multiple databases. Critical Care Time devoted to patient care services described in this note is 35 minutes.   Lonia Farber, MD Pulmonary and Critical Care Medicine [facility] Pager: [phone]  07/11/2013, 12:16 PM

## 2013-07-11 NOTE — Progress Notes (Signed)
eLink Physician-Brief Progress Note Patient Name: Roberta Carpenter DOB: Nov 09, 1960 MRN: [id number]  Date of Service  07/11/2013   HPI/Events of Note   Troponin >20 Blood Cx >>> GPC in clusters   eICU Interventions   ASA Heparin gtt Statin contraindicated - elevated transaminases  BB / ACEI contraindicated - shock Vancomycin per pharmacy    Intervention Category Major Interventions: OtherLonia Farber 07/11/2013, 3:37 PM

## 2013-07-11 NOTE — Progress Notes (Signed)
Brief Note:  Feeding tube placed in nare into the small bowel using Cortrak EAS 2. Pt tolerated procedure well. Xray to follow.   Kendell Bane RD, LDN, CNSC  [phone] Pager  [phone] After Hours Pager

## 2013-07-11 NOTE — Progress Notes (Signed)
eLink Physician-Brief Progress Note Patient Name: Roberta Carpenter DOB: [birth year] MRN: [id number]  Date of Service  07/11/2013   HPI/Events of Note   Bloody bowel movement  eICU Interventions   Heparin d/c'd   Intervention Category Intermediate Interventions: OtherLonia Farber 07/11/2013, 7:49 PM

## 2013-07-11 NOTE — Progress Notes (Signed)
Fentanyl 250cc wasted in sink and versed 50cc wasted in sink. Witnessed by [employer].

## 2013-07-11 NOTE — Progress Notes (Signed)
  Echocardiogram 2D Echocardiogram has been performed.  Arvil Chaco 07/11/2013, 5:20 PM

## 2013-07-11 NOTE — Progress Notes (Signed)
CRITICAL VALUE ALERT  Critical value received:  Ca 6.4 K 2.9  Date of notification: 07/11/13   Time of notification: 0500  Critical value read back:yes  Nurse who received alert: [place] Hodgin/Antonious Omahoney  MD notified (1st page):  Dr. Sung Amabile  Time of first page:  0527  K replacement ordered. Bicarb drip changed.

## 2013-07-11 NOTE — Progress Notes (Signed)
ANTIBIOTIC CONSULT NOTE - FOLLOW UP  Pharmacy Consult for vancomycin/zosyn Indication: r/o sepsis, PNA  No Known Allergies  Patient Measurements: Height: 5\' 8"  (172.7 cm) Weight: 105 lb 13.1 oz (48 kg) IBW/kg (Calculated) : 63.9  Vital Signs: Temp: 98.4 F (36.9 C) (12/17 0800) BP: 103/83 mmHg (12/17 0800) Pulse Rate: 99 (12/17 0800) Intake/Output from previous day: 12/16 0701 - 12/17 0700 In: 7210.8 [I.V.:6255.8; IV Piggyback:955] Out: 200 [Urine:200] Intake/Output from this shift: Total I/O In: 315 [I.V.:265; IV Piggyback:50] Out: -   Labs:  Recent Labs  07/10/13 1042 07/10/13 1408 07/10/13 1710 07/11/13 0415  WBC 12.2* 27.7*  --  29.2*  HGB 12.8 11.1*  --  11.6*  PLT 170 132*  --  81*  CREATININE 2.40* 1.24* 1.50* 2.52*   Estimated Creatinine Clearance: 20.2 ml/min (by C-G formula based on Cr of 2.52).  Recent Labs  07/11/13 0415  VANCORANDOM 36.7      Assessment: 52 yo female s/p cardiac arrest on vancomycin and zosyn for sepsis, r/o PNA. WBC= 29.2, tmax= 104, SCr= 2.52 with CrCl ~ 20. A random vancomycin level this am was 36.6 with 1750mg  vancomycin given at about 3:30 pm on 12/16.  This is approximately a 24hrs level post dose but kinetics are difficult to determine in current setting.  12-16 Vanc>>  12-16 pip-tazo>>  12-16 bc x 2>>  12-16 uc>>  12-16 Sputum>>   Goal of Therapy:  Vancomycin trough level 15-20 mcg/ml  Plan:  -Change zosyn to 2.25gm IV q6h -Hold vancomycin for now -Vancomycin random in am -Will follow renal function, cultures and clinical progress  Harland German, Pharm D 07/11/2013 9:00 AM

## 2013-07-11 NOTE — Progress Notes (Signed)
ANTICOAGULATION CONSULT NOTE - Initial Consult  Pharmacy Consult for heparin Indication: chest pain/ACS  No Known Allergies  Patient Measurements: Height: 5\' 8"  (172.7 cm) Weight: 105 lb 13.1 oz (48 kg) IBW/kg (Calculated) : 63.9 Heparin Dosing Weight: 48  Vital Signs: Temp: 99 F (37.2 C) (12/17 1500) BP: 96/78 mmHg (12/17 1500) Pulse Rate: 102 (12/17 1500)  Labs:  Recent Labs  07/10/13 1042 07/10/13 1408 07/10/13 1710 07/11/13 0415 07/11/13 1223 07/11/13 1230  HGB 12.8 11.1*  --  11.6*  --   --   HCT 41.1 35.3*  --  35.3*  --   --   PLT 170 132*  --  81* 61*  --   APTT  --  61*  --   --  57*  --   LABPROT  --  37.0*  --   --  30.4*  --   INR  --  3.94*  --   --  3.04*  --   CREATININE 2.40* 1.24* 1.50* 2.52*  --   --   TROPONINI  --   --   --   --   --  >20.00*    Estimated Creatinine Clearance: 20.2 ml/min (by C-G formula based on Cr of 2.52).   Medical History: Past Medical History  Diagnosis Date  . MS (multiple sclerosis)     [name] 05/21/2000 (06/29/2013)  . Neurogenic bladder     [name] 05/21/2000 (06/29/2013)  . 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 CAP (community acquired pneumonia)     [name] 06/28/2013 (06/29/2013)    Medications:  Prescriptions prior to admission  Medication Sig Dispense Refill  . baclofen (LIORESAL) 10 MG tablet Take 10 mg by mouth 3 (three) times daily.       Marland Kitchen tiZANidine (ZANAFLEX) 4 MG tablet Take 4 mg by mouth 3 (three) times daily.       Scheduled:  . aspirin  325 mg Oral Daily  . chlorhexidine  15 mL Mouth/Throat BID  . hydrocortisone sod succinate (SOLU-CORTEF) inj  50 mg Intravenous Q6H  . insulin aspart  0-15 Units Subcutaneous Q4H  . pantoprazole (PROTONIX) IV  40 mg Intravenous QHS  . piperacillin-tazobactam (ZOSYN)  IV  2.25 g Intravenous Q6H   Infusions:  . fentaNYL infusion INTRAVENOUS    . norepinephrine (LEVOPHED) Adult infusion 4 mcg/min (07/11/13 1200)  .   sodium bicarbonate  infusion 1000 mL 125 mL/hr at 07/11/13 1451    Assessment: 52 yo who has been here with resp failure. She has been on vanc/zosyn for sepsis. She did get a vanc load the other day and with her renal function, we will get a random level in AM. One blood culture is growing GPC. She will also be started on IV heparin to r/o MI. Her troponin is >20, D-dimer >20. INR is >3. It doesn't look like she is on coumadin PTA. Her LFTs are elevated.  Goal of Therapy:  Heparin level 0.3-0.7 units/ml Monitor platelets by anticoagulation protocol: Yes   Plan:   Will get random vanc level in AM Start heparin at 800 units/hr without bolus Check 8 hrs hep level Daily level cbc  Ulyses Southward, PharmD Pager: [phone] 07/11/2013 3:53 PM

## 2013-07-11 NOTE — Progress Notes (Signed)
Elink nurse called and informed that patient has had large burgundy stool.  Dr Marin Shutter made aware and orders received.

## 2013-07-12 DIAGNOSIS — E872 Acidosis: Secondary | ICD-10-CM

## 2013-07-12 DIAGNOSIS — G35 Multiple sclerosis: Secondary | ICD-10-CM

## 2013-07-12 DIAGNOSIS — R579 Shock, unspecified: Secondary | ICD-10-CM

## 2013-07-12 DIAGNOSIS — I469 Cardiac arrest, cause unspecified: Secondary | ICD-10-CM

## 2013-07-12 DIAGNOSIS — A419 Sepsis, unspecified organism: Secondary | ICD-10-CM

## 2013-07-12 LAB — CBC
Hemoglobin: 9.1 g/dL — ABNORMAL LOW (ref 12.0–15.0)
MCH: 29.4 pg (ref 26.0–34.0)
RBC: 3.09 MIL/uL — ABNORMAL LOW (ref 3.87–5.11)
RDW: 16.3 % — ABNORMAL HIGH (ref 11.5–15.5)

## 2013-07-12 LAB — GLUCOSE, CAPILLARY
Glucose-Capillary: 132 mg/dL — ABNORMAL HIGH (ref 70–99)
Glucose-Capillary: 140 mg/dL — ABNORMAL HIGH (ref 70–99)
Glucose-Capillary: 132 mg/dL — ABNORMAL HIGH (ref 70–99)
Glucose-Capillary: 84 mg/dL (ref 70–99)

## 2013-07-12 LAB — HEPATIC FUNCTION PANEL
AST: 702 U/L — ABNORMAL HIGH (ref 0–37)
Albumin: 1.9 g/dL — ABNORMAL LOW (ref 3.5–5.2)
Alkaline Phosphatase: 78 U/L (ref 39–117)
Bilirubin, Direct: 1.2 mg/dL — ABNORMAL HIGH (ref 0.0–0.3)
Indirect Bilirubin: 0.4 mg/dL (ref 0.3–0.9)

## 2013-07-12 LAB — BASIC METABOLIC PANEL
CO2: 27 mEq/L (ref 19–32)
Creatinine, Ser: 3.5 mg/dL — ABNORMAL HIGH (ref 0.50–1.10)
GFR calc Af Amer: 16 mL/min — ABNORMAL LOW (ref >90)
GFR calc non Af Amer: 14 mL/min — ABNORMAL LOW (ref >90)
Glucose, Bld: 156 mg/dL — ABNORMAL HIGH (ref 70–99)
Potassium: 3.7 mEq/L (ref 3.5–5.1)
Sodium: 150 mEq/L — ABNORMAL HIGH (ref 135–145)

## 2013-07-12 LAB — PROTIME-INR
INR: 1.94 — ABNORMAL HIGH (ref 0.00–1.49)
Prothrombin Time: 21.6 seconds — ABNORMAL HIGH (ref 11.6–15.2)

## 2013-07-12 LAB — APTT: aPTT: 48 seconds — ABNORMAL HIGH (ref 24–37)

## 2013-07-12 MED ORDER — SODIUM CHLORIDE 0.9 % IV SOLN
INTRAVENOUS | Status: DC | PRN
  Administered 2013-07-12: via INTRAVENOUS

## 2013-07-12 NOTE — Progress Notes (Signed)
PULMONARY  / CRITICAL CARE MEDICINE  Name: Roberta Carpenter MRN: [id number] DOB: 1961/06/18    ADMISSION DATE:  07/10/2013   REFERRING MD :  EDP PRIMARY SERVICE: PCCM  CHIEF COMPLAINT:  Acute respiratory failure  BRIEF PATIENT DESCRIPTION: 52 yo with advanced MS, bed bound, non verbal, requiring assistance for all ADL was found 12/16 "gasping for air" by sister, who provided bystander CPR, PEA arrest with down time x 15-20 mins.  SIGNIFICANT EVENTS / STUDIES:  12/16  Admitted after cardiac arrest, made limited code 12/16  Head CT >>> nad, changes consistent with MS 12/17  TTE >>> LVH, EF 65%, diastolic dysfunction, small effusion without tamponade 12/17  Heparin gtt started as Troponin>20, but stopped after bloody bowel movement 12/18  Goals of care meeting with daughters ( medical POAs) >>> DNR, extubate when ready, comfort if deteriorates   LINES / TUBES: OTT 12/16 >>> R IJ CVL 12/16 >>> R fem A-line 12/16 >>> Foley 12/16 >>>  CULTURES: 12/16 MRSA PCR >>> neg 12/16 Blood >>> GPC in clusters >>> 12/16 Urine >>> neg  ANTIBIOTICS: Zosyn 12/16 >>> Vancomycin 12/16 >>>  SUBJECTIVE: Anuric. Heparin started, but d/c'd after bloody bowel movement.  VITAL SIGNS: Temp:  [98.2 F (36.8 C)-99.1 F (37.3 C)] 98.2 F (36.8 C) (12/18 0905) Pulse Rate:  [75-110] 85 (12/18 0905) Resp:  [18-26] 18 (12/18 0900) BP: (77-125)/(53-87) 113/70 mmHg (12/18 0905) SpO2:  [95 %-100 %] 100 % (12/18 0905) Arterial Line BP: (93-128)/(53-82) 113/71 mmHg (12/18 0900) FiO2 (%):  [40 %] 40 % (12/18 0905) Weight:  [53.5 kg (117 lb 15.1 oz)] 53.5 kg (117 lb 15.1 oz) (12/18 0500)  HEMODYNAMICS:   VENTILATOR SETTINGS: Vent Mode:  [-] CPAP;PSV FiO2 (%):  [40 %] 40 % Set Rate:  [22 bmp] 22 bmp Vt Set:  [380 mL] 380 mL PEEP:  [5 cmH20] 5 cmH20 Pressure Support:  [5 cmH20] 5 cmH20 Plateau Pressure:  [14 cmH20] 14 cmH20  INTAKE / OUTPUT: Intake/Output     12/17 0701 - 12/18 0700 12/18 0701 -  12/19 0700   I.V. (mL/kg) 3510.2 (65.6) 293.8 (5.5)   IV Piggyback 350    Total Intake(mL/kg) 3860.2 (72.2) 293.8 (5.5)   Urine (mL/kg/hr) 214 (0.2)    Stool 1 (0)    Total Output 215     Net +3645.2 +293.8        Stool Occurrence 3 x      PHYSICAL EXAMINATION: General:  Tolerating SBT, no distress Neuro: Opens eyes spontaneously, cough / gag diminished HEENT:  PERRL, OETT / OGT, bloody oral secretions Cardiovascular:  RRR, no m/r/g Lungs:  Bilateral diminished air entry, rhonchi Abdomen:  Soft, grimacing to palpation, bowel sounds diminished Musculoskeletal:  Bilateral foot drop, trace edema Skin:  Intact  LABS:  CBC  Recent Labs Lab 07/10/13 1408 07/11/13 0415 07/11/13 1223 07/12/13 0930  WBC 27.7* 29.2*  --  31.8*  HGB 11.1* 11.6*  --  9.1*  HCT 35.3* 35.3*  --  26.1*  PLT 132* 81* 61* 23*   Coag's  Recent Labs Lab 07/10/13 1408 07/11/13 1223 07/12/13 0930  APTT 61* 57* 48*  INR 3.94* 3.04* 1.94*   BMET  Recent Labs Lab 07/10/13 1710 07/11/13 0415 07/12/13 0930  NA 156* 159* 150*  K 2.3* 2.9* 3.7  CL 128* 122* 106  CO2 10* 15* 27  BUN 40* 56* 65*  CREATININE 1.50* 2.52* 3.50*  GLUCOSE 329* 180* 156*   Electrolytes  Recent Labs Lab 07/10/13  1408 07/10/13 1710 07/11/13 0415 07/12/13 0930  CALCIUM 6.4* 4.7* 6.4* 5.8*  MG 2.1  --   --   --   PHOS 7.1*  --   --   --    Sepsis Markers  Recent Labs Lab 07/10/13 1255 07/10/13 1408 07/11/13 1230  LATICACIDVEN 7.5* 8.7* 4.3*  PROCALCITON 11.85  --   --    ABG  Recent Labs Lab 07/10/13 1223 07/10/13 1728 07/11/13 0454  PHART 7.189* 7.315* 7.412  PCO2ART 26.7* 23.5* 24.4*  PO2ART 487.0* 69.0* 150.0*   Liver Enzymes  Recent Labs Lab 07/10/13 1042 07/10/13 1408 07/12/13 0930  AST 2568* 2963* 702*  ALT 1123* 1327* 698*  ALKPHOS 111 96 78  BILITOT 1.1 1.1 1.6*  ALBUMIN 2.7* 2.0* 1.9*   Cardiac Enzymes  Recent Labs Lab 07/11/13 1230  TROPONINI >20.00*    Glucose  Recent Labs Lab 07/11/13 1328 07/11/13 1644 07/11/13 2032 07/11/13 2339 07/12/13 0343 07/12/13 0727  GLUCAP 135* 83 78 86 132* 140*   CXR: None today  ASSESSMENT / PLAN:  PULMONARY A: Acute respiratory failure in setting of cardiac arrest. P:   Goal pH>7.30, SpO2>92 SBT with intent to extubate Do NOT re-intubate for family discussion VAP bundle Daily SBT Trend ABG/CXR  CARDIOVASCULAR A: STEMI. Shock (septic vs cardiogenic). P:  Goal MAP>65 Levophed gtt titrate to off Trend Troponin / Lactate DNR  RENAL A:  Acute renal failure in setting of shock. Acidosis resolved. P:   D/c Bicarbonate  GASTROINTESTINAL A:  Bloody bowel movement last night - suspect ischemic bowel.  Shocked liver / acute liver failure.  Severe protein calorie malnutrition.  P:   NPO Protonix for GI Px Trend LFT TF on hold as plan extubation, will start if survives  HEMATOLOGIC A: Thrombocytopenia / coagulopathy.  Suspect DIC. P:  SCDs for DVT Px  INFECTIOUS A: Bacteremia (GPC in clusters). P:   Cx / abx as above  ENDOCRINE A:  Hyperglycemia.  Presumed adrenal insufficiency. P:   Hydrocortisone 50 q6h  SSI  NEUROLOGIC A:  Acute encephalopathy.  Advanced multiple sclerosis. P:   D/c Fentanyl gtt  I have personally obtained history, examined patient, evaluated and interpreted laboratory and imaging results, reviewed medical records, formulated assessment / plan and placed orders.  CRITICAL CARE:  The patient is critically ill with multiple organ systems failure and requires high complexity decision making for assessment and support, frequent evaluation and titration of therapies, application of advanced monitoring technologies and extensive interpretation of multiple databases. Critical Care Time devoted to patient care services described in this note is 35 minutes.   Lonia Farber, MD Pulmonary and Critical Care Medicine [facility] Pager: [phone]  07/12/2013, 11:41 AM

## 2013-07-12 NOTE — Progress Notes (Signed)
Per MD extubated pt to 4L [place]

## 2013-07-12 NOTE — Progress Notes (Signed)
Chaplain paged to be with family as medical team put pt on comfort care.  Chaplain responded and spent time with the family, talking with them, and sharing in prayer.  Family seemed grateful for the chaplain presence and also informed the chaplain that they have their own faith resources.

## 2013-07-12 NOTE — Progress Notes (Signed)
Nutrition Brief Note  Chart reviewed. Pt now transitioning to comfort care. Terminally extubated this afternoon.  No further nutrition interventions warranted at this time.  Please re-consult as needed.   Loyce Dys, MS RD LDN Clinical Inpatient Dietitian Pager: [phone] Weekend/After hours pager: [phone]

## 2013-07-12 NOTE — Progress Notes (Signed)
ETT secure. Moved ETT to left mouth. Small breakdown noted on lips.

## 2013-07-12 NOTE — Progress Notes (Signed)
ETT secure. Small breakdown noted on lips. Moved ETT to pts center. Deep oral sxn done

## 2013-07-12 NOTE — Procedures (Signed)
Extubation Procedure Note  Patient Details:   Name: Roberta Carpenter DOB: 02-Mar-1961 MRN: [id number]   Airway Documentation:  Airway 7 mm (Active)  Secured at (cm) 25 cm 07/12/2013 12:11 PM  Measured From Lips 07/12/2013 12:11 PM  Secured Location Left 07/12/2013 12:11 PM  Secured By [employer] 07/12/2013 12:11 PM  Tube Holder Repositioned Yes 07/12/2013 12:11 PM  Cuff Pressure (cm H2O) 24 cm H2O 07/12/2013  9:05 AM  Site Condition Dry 07/12/2013 12:11 PM    Evaluation  O2 sats:100  Complications:no complications Patient did tolerate procedure well. Bilateral Breath Sounds: Clear;Diminished Suctioning: Oral;Airway Per MD ok to extubate with no reintubation. Pt has positive cuff leak. Good cough/gag. Extubated pt to 4L . HR 94, sats 100, rr 20. No stridor  Kandis Nab 07/12/2013, 1:12 PM

## 2013-07-13 LAB — BASIC METABOLIC PANEL
BUN: 74 mg/dL — ABNORMAL HIGH (ref 6–23)
CO2: 27 mEq/L (ref 19–32)
Chloride: 107 mEq/L (ref 96–112)
GFR calc Af Amer: 13 mL/min — ABNORMAL LOW (ref >90)
Potassium: 4.3 mEq/L (ref 3.5–5.1)
Sodium: 153 mEq/L — ABNORMAL HIGH (ref 135–145)

## 2013-07-13 LAB — CBC
HCT: 26.4 % — ABNORMAL LOW (ref 36.0–46.0)
Platelets: 26 10*3/uL — CL (ref 150–400)
RDW: 16.1 % — ABNORMAL HIGH (ref 11.5–15.5)
WBC: 35.3 10*3/uL — ABNORMAL HIGH (ref 4.0–10.5)

## 2013-07-13 LAB — GLUCOSE, CAPILLARY
Glucose-Capillary: 101 mg/dL — ABNORMAL HIGH (ref 70–99)
Glucose-Capillary: 102 mg/dL — ABNORMAL HIGH (ref 70–99)
Glucose-Capillary: 85 mg/dL (ref 70–99)
Glucose-Capillary: 90 mg/dL (ref 70–99)
Glucose-Capillary: 91 mg/dL (ref 70–99)

## 2013-07-13 LAB — APTT: aPTT: 34 seconds (ref 24–37)

## 2013-07-13 MED ORDER — HYDROCORTISONE SOD SUCCINATE 100 MG IJ SOLR
50.0000 mg | Freq: Two times a day (BID) | INTRAMUSCULAR | Status: DC
  Administered 2013-07-13 – 2013-07-14 (×3): 50 mg via INTRAVENOUS
  Administered 2013-07-15: 06:00:00 via INTRAVENOUS
  Administered 2013-07-15 – 2013-07-18 (×4): 50 mg via INTRAVENOUS
  Filled 2013-07-13 (×13): qty 1

## 2013-07-13 MED ORDER — SODIUM CHLORIDE 0.45 % IV SOLN
INTRAVENOUS | Status: DC
  Administered 2013-07-13: 75 mL/h via INTRAVENOUS

## 2013-07-13 NOTE — Progress Notes (Signed)
PULMONARY  / CRITICAL CARE MEDICINE  Name: Roberta Carpenter  MRN: [id number] DOB: 08-Oct-1960    ADMISSION DATE:  07/10/2013   REFERRING MD :  EDP PRIMARY SERVICE: PCCM  CHIEF COMPLAINT:  Acute respiratory failure  BRIEF PATIENT DESCRIPTION: 52 yo with advanced MS, bed bound, non verbal, requiring assistance for all ADL was found 12/16 "gasping for air" by sister, who provided bystander CPR, PEA arrest with down time x 15-20 mins.  SIGNIFICANT EVENTS / STUDIES:  12/16  Admitted after cardiac arrest, made limited code 12/16  Head CT >>> nad, changes consistent with MS 12/17  TTE >>> LVH, EF 65%, diastolic dysfunction, small effusion without tamponade 12/17  Heparin gtt started as Troponin>20, but stopped after bloody bowel movement 12/18  Goals of care meeting with daughters ( medical POAs) >>> DNR, extubate when ready, comfort if deteriorates 12/18  Terminally extubate   LINES / TUBES: OTT 12/16 >>> 12/18 R IJ CVL 12/16 >>> R fem A-line 12/16 >>> 12/18 Foley 12/16 >>>  CULTURES: 12/16 MRSA PCR >>> neg 12/16 Blood >>> GPC in clusters >>> 12/16 Urine >>> neg  ANTIBIOTICS: Zosyn 12/16 >>> Vancomycin 12/16 >>>  SUBJECTIVE:  No acute issues overnight.  VITAL SIGNS: Temp:  [97.4 F (36.3 C)-99.3 F (37.4 C)] 98.2 F (36.8 C) (12/19 0800) Pulse Rate:  [81-106] 91 (12/19 0800) Resp:  [16-22] 19 (12/19 0800) BP: (81-122)/(46-84) 97/64 mmHg (12/19 0800) SpO2:  [99 %-100 %] 100 % (12/19 0800) Arterial Line BP: (92-129)/(51-79) 129/76 mmHg (12/19 0800) FiO2 (%):  [40 %] 40 % (12/18 1211) Weight:  [54.6 kg (120 lb 5.9 oz)] 54.6 kg (120 lb 5.9 oz) (12/19 0500)  HEMODYNAMICS:   VENTILATOR SETTINGS: Vent Mode:  [-] CPAP;PSV FiO2 (%):  [40 %] 40 % PEEP:  [5 cmH20] 5 cmH20 Pressure Support:  [5 cmH20] 5 cmH20  INTAKE / OUTPUT: Intake/Output     12/18 0701 - 12/19 0700 12/19 0701 - 12/20 0700   I.V. (mL/kg) 745.2 (13.6) 20 (0.4)   Other 35    NG/GT 30    IV Piggyback 150     Total Intake(mL/kg) 960.2 (17.6) 20 (0.4)   Urine (mL/kg/hr) 40 (0)    Stool     Total Output 40     Net +920.2 +20        Stool Occurrence 2 x      PHYSICAL EXAMINATION: General:  Resting comfortable Neuro: Obtunded HEENT:  Sluggish pupils Cardiovascular:  RRR, no m/r/g Lungs:  Bilateral diminished air entry Abdomen:  Soft, non tender, bowel sounds present Musculoskeletal:  Bilateral foot drop, trace edema Skin:  Intact  LABS:  CBC  Recent Labs Lab 07/11/13 0415 07/11/13 1223 07/12/13 0930 07/13/13 0410  WBC 29.2*  --  31.8* 35.3*  HGB 11.6*  --  9.1* 9.1*  HCT 35.3*  --  26.1* 26.4*  PLT 81* 61* 23* 26*   Coag's  Recent Labs Lab 07/11/13 1223 07/12/13 0930 07/13/13 0410  APTT 57* 48* 34  INR 3.04* 1.94* 1.40   BMET  Recent Labs Lab 07/11/13 0415 07/12/13 0930 07/13/13 0410  NA 159* 150* 153*  K 2.9* 3.7 4.3  CL 122* 106 107  CO2 15* 27 27  BUN 56* 65* 74*  CREATININE 2.52* 3.50* 4.28*  GLUCOSE 180* 156* 100*   Electrolytes  Recent Labs Lab 07/10/13 1408  07/11/13 0415 07/12/13 0930 07/13/13 0410  CALCIUM 6.4*  < > 6.4* 5.8* 5.9*  MG 2.1  --   --   --   --  PHOS 7.1*  --   --   --   --   < > = values in this interval not displayed. Sepsis Markers  Recent Labs Lab 07/10/13 1255 07/10/13 1408 07/11/13 1230  LATICACIDVEN 7.5* 8.7* 4.3*  PROCALCITON 11.85  --   --    ABG  Recent Labs Lab 07/10/13 1223 07/10/13 1728 07/11/13 0454  PHART 7.189* 7.315* 7.412  PCO2ART 26.7* 23.5* 24.4*  PO2ART 487.0* 69.0* 150.0*   Liver Enzymes  Recent Labs Lab 07/10/13 1042 07/10/13 1408 07/12/13 0930  AST 2568* 2963* 702*  ALT 1123* 1327* 698*  ALKPHOS 111 96 78  BILITOT 1.1 1.1 1.6*  ALBUMIN 2.7* 2.0* 1.9*   Cardiac Enzymes  Recent Labs Lab 07/11/13 1230  TROPONINI >20.00*   Glucose  Recent Labs Lab 07/12/13 0727 07/12/13 1231 07/12/13 1639 07/12/13 2017 07/12/13 2316 07/13/13 0818  GLUCAP 140* 132* 84 87 93 85    CXR: None today  ASSESSMENT / PLAN:  PULMONARY A: Acute respiratory failure in setting of cardiac arrest, resolved. P:   Supplemental oxygen PRN Do NOT reintubate  CARDIOVASCULAR A: STEMI. Shock (septic vs cardiogenic), resolved. P:  ASA D/c Levophed gtt with intent NOT to restart DNR  RENAL A:  Acute renal failure in setting of shock, worsening. Acidosis resolved. Hypernatremia. P:   Start 1/2NS@75  NOT a candidate for HD  GASTROINTESTINAL A:  Bloody bowel movement last night - suspect ischemic bowel.  Shocked liver / acute liver failure.  Severe protein calorie malnutrition.  P:   NPO Protonix for GI Px TF if appropriate after discussion with Palliative Care  HEMATOLOGIC A: Thrombocytopenia / coagulopathy.  Suspect DIC. P:  SCDs for DVT Px  INFECTIOUS A: Bacteremia (GPC in clusters). P:   Cx / abx as above  ENDOCRINE A:  Hyperglycemia initially, now normoglycemic. Presumed adrenal insufficiency. P:   Decrease  Hydrocortisone 50 q12h  SSI  NEUROLOGIC A:  Acute encephalopathy.  Anoxia. Advanced multiple sclerosis. P:   No intervention required  Transfer to medical floor. Palliative Care consulted for possible hospice referral. If Palliative Care is not interested in taking on their service, will ask TRH to assume care.  I have personally obtained history, examined patient, evaluated and interpreted laboratory and imaging results, reviewed medical records, formulated assessment / plan and placed orders.  Lonia Farber, MD Pulmonary and Critical Care Medicine [facility] Pager: [phone]  07/13/2013, 9:26 AM

## 2013-07-13 NOTE — Progress Notes (Signed)
Palliative Consult Received. Family meetings scheduled for 10AM 12/20 with patient's mother and other family for goals of care discussion and Hospice options.  Anderson Malta, DO Palliative Medicine

## 2013-07-13 NOTE — Progress Notes (Signed)
L femoral A-line pulled per protocol. 15 min manual pressure to achieve hemostasis. Site dressed with 2x2 and tegaderm. Pt tolerated well, site CDI without hematoma or bruising.

## 2013-07-14 DIAGNOSIS — E43 Unspecified severe protein-calorie malnutrition: Secondary | ICD-10-CM

## 2013-07-14 DIAGNOSIS — F079 Unspecified personality and behavioral disorder due to known physiological condition: Secondary | ICD-10-CM

## 2013-07-14 DIAGNOSIS — A419 Sepsis, unspecified organism: Secondary | ICD-10-CM

## 2013-07-14 LAB — GLUCOSE, CAPILLARY: Glucose-Capillary: 105 mg/dL — ABNORMAL HIGH (ref 70–99)

## 2013-07-14 LAB — CULTURE, BLOOD (ROUTINE X 2)

## 2013-07-14 MED ORDER — DIAZEPAM 5 MG/ML IJ SOLN
2.5000 mg | INTRAMUSCULAR | Status: DC | PRN
  Administered 2013-07-14 – 2013-07-15 (×2): 2.5 mg via INTRAVENOUS
  Filled 2013-07-14 (×3): qty 2

## 2013-07-14 MED ORDER — SODIUM CHLORIDE 0.9 % IJ SOLN
10.0000 mL | INTRAMUSCULAR | Status: DC | PRN
  Administered 2013-07-14 – 2013-07-15 (×3): 20 mL
  Administered 2013-07-16: 10 mL
  Administered 2013-07-16: 20 mL
  Administered 2013-07-16 – 2013-07-17 (×3): 10 mL

## 2013-07-14 MED ORDER — DIAZEPAM 5 MG/ML IJ SOLN
2.5000 mg | Freq: Every day | INTRAMUSCULAR | Status: DC
  Administered 2013-07-16 – 2013-07-17 (×3): 2.5 mg via INTRAVENOUS
  Filled 2013-07-14 (×2): qty 2

## 2013-07-14 MED ORDER — SODIUM CHLORIDE 0.9 % IJ SOLN: 10.0000 mL | Freq: Two times a day (BID) | INTRAMUSCULAR | Status: DC

## 2013-07-14 MED ORDER — SODIUM CHLORIDE 0.9 % IV SOLN
INTRAVENOUS | Status: DC
  Administered 2013-07-14: 10 mL/h via INTRAVENOUS
  Administered 2013-07-17: 20 mL/h via INTRAVENOUS

## 2013-07-14 MED ORDER — SODIUM CHLORIDE 0.9 % IV SOLN
0.2500 mg/h | INTRAVENOUS | Status: DC
  Administered 2013-07-14 – 2013-07-18 (×8): 0.25 mg/h via INTRAVENOUS
  Filled 2013-07-14 (×5): qty 2.5

## 2013-07-14 MED ORDER — HYDROMORPHONE BOLUS VIA INFUSION
0.5000 mg | INTRAVENOUS | Status: DC | PRN
  Administered 2013-07-16 – 2013-07-17 (×4): 0.5 mg via INTRAVENOUS
  Filled 2013-07-14: qty 1

## 2013-07-14 MED ORDER — ATROPINE SULFATE 1 % OP SOLN
2.0000 [drp] | OPHTHALMIC | Status: DC | PRN
  Administered 2013-07-14 – 2013-07-15 (×4): 2 [drp] via SUBLINGUAL
  Filled 2013-07-14: qty 2

## 2013-07-14 NOTE — Progress Notes (Signed)
Diazepam 7.5 mg wasted as witnessed by Konrad Dolores. R.N..

## 2013-07-14 NOTE — Consult Note (Addendum)
Palliative Medicine Team at [facility]  Date: 07/14/2013   Patient Name: Roberta Carpenter  DOB: 1961/03/04  MRN: [id number]  Age / Sex: 52 y.o., female   PCP: Provider Not In System Referring Physician: Edsel Petrin, DO  HPI/Reason for Consultation: 52 yo woman with advanced MS, bedbound but cared for at home prior to admission by a loving and devoted family. She unfortunately suffered a cardiac arrest at home on 12/16 and was resucitated by EMS and has been in the ICU for the past few days with minimal improvement. She was terminally extubated yesterday and transitioned to full comfort care by [facility]. PMT asked to consult to assist with symptom management and hospice options.  Participants in Discussion: Patient's mother, two daughters and her brother  Goals/Summary of Discussion:  1. Code Status:  DNR  2. Scope of Treatment:  Full Comfort Care  Comfort Feeding Only   Remove all lines and cords except for IV access  Pain and symptom management primary goal  3. Assessment/Plan: Roberta Carpenter is actively dying of anoxic injuries and multi-organ failure following a cardiac arrest at home. She was terminally extubated yesterday. She is unresponsive. I anticipate her death within hour-days.   Primary Diagnoses  1. VDRF 2. PNA 3. MS 4. Dysphagia 5. Immobility 6. Anoxic brain injury 7. Acute renal failure 8. Ischemic bowel-GIB 9. Bacteremia 10. Adrenal Inusuff  Prognosis:Hours- Days  PPS 10   Active Symptoms 1. Dyspnea 2. Pain 3. Immobility 4. Agitation/encpehaolopathy 5. Dysphagia, Failure to thrive  4. Palliative Prophylaxis:   Terminal Secretions-atropine  Breakthrough Pain and Dyspnea-hydromorphone   Agitation and Delirium-ativan  Nausea-zofran  5. Psychosocial Spiritual Asssessment/Interventions:  Patient and Family Adjustment to Illness/Prognosis: Appropriate grief-they described Roberta Carpenter as a fun and kind person who loved people. She worked hard and raised  her two daughters and even a nephew from the time he was 68 weeks old.  Spiritual Concerns or Needs: Will consult chaplain  6. Disposition: Anticipate hospital death-can consider GIP hospice admission on Monday if that is reasonable given her progression. I have requested patient be move to [address] [place]- she has a large family and current room cannot accommodate this-there is also not sufficient family meeting space for [facility] Hosp[facility] care if we proceed in this direction on Monday.  Social History:   reports that she has never smoked. She has never used smokeless tobacco. She reports that she does not drink alcohol or use illicit drugs. Living Situation: PTA home with family Occupation: works in [employer], enjoyed customer service  Family History: Family History  Problem Relation Age of Onset  . Other      no h/o premature CAD.    Active Medications:  Outpatient medications: Prescriptions prior to admission  Medication Sig Dispense Refill  . baclofen (LIORESAL) 10 MG tablet Take 10 mg by mouth 3 (three) times daily.       Marland Kitchen tiZANidine (ZANAFLEX) 4 MG tablet Take 4 mg by mouth 3 (three) times daily.        Current medications: Infusions: . sodium chloride 75 mL/hr (07/13/13 1007)  . sodium chloride 20 mL/hr (07/12/13 2000)    Scheduled Medications: . aspirin  325 mg Oral Daily  . chlorhexidine  15 mL Mouth/Throat BID  . hydrocortisone sod succinate (SOLU-CORTEF) inj  50 mg Intravenous Q12H  . insulin aspart  0-15 Units Subcutaneous Q4H  . pantoprazole (PROTONIX) IV  40 mg Intravenous QHS  . piperacillin-tazobactam (ZOSYN)  IV  2.25 g Intravenous Q6H  PRN Medications: sodium chloride, acetaminophen (TYLENOL) oral liquid 160 mg/5 mL   Vital Signs: BP 95/66  Pulse 74  Temp(Src) 98.4 F (36.9 C) (Oral)  Resp 17  Ht 5\' 8"  (1.727 m)  Wt 55.883 kg (123 lb 3.2 oz)  BMI 18.74 kg/m2  SpO2 100%   Physical Exam:  General appearance: NAD, unresponsive Eyes:red  conjuntiva HENT: tongue is swollen, MMM Neck: Trachea midline; FROM, supple, no thyromegaly or lymphadenopathy Lungs:course rhonchi Abdomen: Soft, non-tender; no masses or HSM Extremities: ++ peripheral edema o Skin: Normal temperature Psych: unresponsive, occasionally moans. Labs:  Basic or Comprehensive Metabolic Panel:    Component Value Date/Time   NA 153* 07/13/2013 0410   K 4.3 07/13/2013 0410   CL 107 07/13/2013 0410   CO2 27 07/13/2013 0410   BUN 74* 07/13/2013 0410   CREATININE 4.28* 07/13/2013 0410   GLUCOSE 100* 07/13/2013 0410   CALCIUM 5.9* 07/13/2013 0410   AST 702* 07/12/2013 0930   ALT 698* 07/12/2013 0930   ALKPHOS 78 07/12/2013 0930   BILITOT 1.6* 07/12/2013 0930   PROT 4.8* 07/12/2013 0930   ALBUMIN 1.9* 07/12/2013 0930     CBC:    Component Value Date/Time   WBC 35.3* 07/13/2013 0410   HGB 9.1* 07/13/2013 0410   HCT 26.4* 07/13/2013 0410   PLT 26* 07/13/2013 0410   MCV 84.9 07/13/2013 0410   NEUTROABS 9.4* 07/10/2013 1042   LYMPHSABS 2.2 07/10/2013 1042   MONOABS 0.6 07/10/2013 1042   EOSABS 0.0 07/10/2013 1042   BASOSABS 0.0 07/10/2013 1042     BNP (last 3 results) No results found for this basename: PROBNP,  in the last 8760 hours  CBG (last 3)   Recent Labs  07/13/13 2352 07/14/13 0411 07/14/13 0752  GLUCAP 101* 90 102*    Imaging:  No results found.  Other Data:  (2D echo, EKG...)   Educational Materials Given:  DNR: Yes  MOST: No Healthcare Power-of-Attorney: No   Time: 50 minutes Greater than 50%  of this time was spent counseling and coordinating care related to the above assessment and plan.  Signed by: Edsel Petrin, DO  07/14/2013, 10:17 AM  Please contact Palliative Medicine Team phone at [phone] for questions and concerns.

## 2013-07-14 NOTE — Progress Notes (Signed)
[facility]                                                                                Patient Demographics  Roberta Carpenter, is a 52 y.o. female, DOB - June 21, 1961, ZOX:[id number]  Admit date - 07/10/2013   Admitting Physician Oretha Milch, MD  Outpatient Primary MD for the patient is Provider Not In System  LOS - 4   Chief Complaint  Patient presents with  . Cardiac Arrest      Interim history 52 year old with advanced MS, bedbound, nonverbal, requiring assistance for all of her daily activities was found on [address] by the Sister. Patient required CPR was found to have PEA arrest with downtime of 15-20 minutes.  Assessment & Plan    Active Problems:   MS (multiple sclerosis)   Acute MI   Shock   Acidosis, metabolic   Organic brain syndrome   Protein-calorie malnutrition, severe  STEMI with cardiogenic and septic shock -Patient did require Levophed GTT -She was made DO NOT RESUSCITATE -Currently on aspirin  Status post Cardiac arrest -Resolved  Acute respiratory failure in setting of cardiac arrest -Resolved, continue supplemental oxygen to maintain sats above 92% -Currently do not reintubate  Acute renal failure with metabolic acidosis and hypernatremia -Likely secondary to shock -Creatinine 4.28, acidosis resolved -Acidosis resolved  Possible ischemic bowel with shocked liver/acute liver failure -Patient has had bloody bowel movements. -Currently n.p.o. -Continue Protonix -Likely no intervention, discussion with palliative care  Severe protein calorie malnutrition -Currently n.p.o. May consider tube feeds after discussion with palliative care  Thrombocytopenia with coagulation with possible DIC -Secondary to shock  Bacteremia with gram-positive cocci clusters -Currently on vancomycin with pharmacy monitoring -Blood cultures returned Staphylococcus species coagulase-negative  Hypoglycemia with possible adrenal insufficiency -Hypoglycemia  resolved -Continue hydrocortisone 50 mg every 12 hours  Acute encephalopathy with anoxic brain injury -No intervention at this time  Advanced multiple sclerosis -No intervention at this time  Code Status: DNR  Family Communication: None at this time. Family meeting with palliative care today.  Disposition Plan: Admitted.  Procedures/Course 12/16 Admitted after cardiac arrest, made limited code  12/16 Head CT >>> nad, changes consistent with MS  12/17 TTE >>> LVH, EF 65%, diastolic dysfunction, small effusion without tamponade  12/17 Heparin gtt started as Troponin>20, but stopped after bloody bowel movement  12/18 Goals of care meeting with daughters ( medical POAs) >>> DNR, extubate when ready, comfort if deteriorates  12/18 Terminally extubate   Consults   PCCM Palliative Care Cardiology  DVT Prophylaxis SCDs   Lab Results  Component Value Date   PLT 26* 07/13/2013    Medications  Scheduled Meds: . aspirin  325 mg Oral Daily  . chlorhexidine  15 mL Mouth/Throat BID  . hydrocortisone sod succinate (SOLU-CORTEF) inj  50 mg Intravenous Q12H  . insulin aspart  0-15 Units Subcutaneous Q4H  . pantoprazole (PROTONIX) IV  40 mg Intravenous QHS  . piperacillin-tazobactam (ZOSYN)  IV  2.25 g Intravenous Q6H   Continuous Infusions: . sodium chloride 75 mL/hr (07/13/13 1007)  . sodium chloride 20 mL/hr (07/12/13 2000)   PRN Meds:.sodium chloride, acetaminophen (TYLENOL) oral liquid 160 mg/5  mL  Antibiotics    Anti-infectives   Start     Dose/Rate Route Frequency Ordered Stop   07/11/13 1200  piperacillin-tazobactam (ZOSYN) IVPB 2.25 g     2.25 g 100 mL/hr over 30 Minutes Intravenous 4 times per day 07/11/13 0901     07/10/13 2200  piperacillin-tazobactam (ZOSYN) IVPB 3.375 g  Status:  Discontinued     3.375 g 12.5 mL/hr over 240 Minutes Intravenous 3 times per day 07/10/13 1324 07/11/13 0901   07/10/13 1330  vancomycin (VANCOCIN) 1,750 mg in sodium chloride 0.9 %  500 mL IVPB     1,750 mg 250 mL/hr over 120 Minutes Intravenous STAT 07/10/13 1311 07/10/13 1730   07/10/13 1230  piperacillin-tazobactam (ZOSYN) IVPB 3.375 g     3.375 g 12.5 mL/hr over 240 Minutes Intravenous  Once 07/10/13 1229 07/10/13 1643      Time Spent in minutes   30 minutes   Roberta Carpenter D.O. on 07/14/2013 at 10:11 AM  Between 7am to 7pm - Pager - [phone]  After 7pm go to [email or web address] - password [email or web address]  And look for the night coverage person covering for me after hours  [facility] Office  [phone]    Subjective:   Roberta Carpenter seen and examined today.   Patient nonverbal.  Objective:   Filed Vitals:   07/13/13 1600 07/13/13 2119 07/14/13 0416 07/14/13 0753  BP: 96/67 103/73 97/65 95/66   Pulse: 80 81 74 74  Temp: 97.9 F (36.6 C) 97.9 F (36.6 C) 97.8 F (36.6 C) 98.4 F (36.9 C)  TempSrc: Core (Comment) Axillary Oral Oral  Resp: 12 14 15 17   Height:      Weight:  55.883 kg (123 lb 3.2 oz)    SpO2: 100% 100% 100% 100%    Wt Readings from Last 3 Encounters:  07/13/13 55.883 kg (123 lb 3.2 oz)  06/29/13 99.5 kg (219 lb 5.7 oz)  01/19/11 54.795 kg (120 lb 12.8 oz)     Intake/Output Summary (Last 24 hours) at 07/14/13 1011 Last data filed at 07/14/13 0900  Gross per 24 hour  Intake 1751.25 ml  Output     90 ml  Net 1661.25 ml    Exam  General: Well developed, well nourished, NAD, appears stated age  HEENT: NCAT, swollen tongue, moist mucous membranes  Neck: Supple, no JVD, no masses  Cardiovascular: S1 S2 auscultated  Respiratory: Coarse breath sounds  Abdomen: Soft, nontender, nondistended  Extremities: warm dry without cyanosis clubbing. + Edema in the lower extremities  Neuro: Unable to assess  Skin: Warm, dry  Psych: Unable to assess   Data Review   Micro Results Recent Results (from the past 240 hour(s))  CULTURE, BLOOD (ROUTINE X 2)     Status: None   Collection Time    07/10/13 11:15 AM       Result Value Range Status   Specimen Description BLOOD ARM LEFT   Final   Special Requests BOTTLES DRAWN AEROBIC ONLY 0.5CC   Final   Culture  Setup Time     Final   Value: 07/10/2013 17:40     Performed at [facility]   Culture     Final   Value: STAPHYLOCOCCUS SPECIES (COAGULASE NEGATIVE)     Note: Gram Stain Report Called to,Read Back By and Verified With: GAIL TURNER ON 07/11/2013 AT 10:57P BY WILEJ     Performed at [facility]   Report Status PENDING   Incomplete  CULTURE, BLOOD (  ROUTINE X 2)     Status: None   Collection Time    07/10/13 11:45 AM      Result Value Range Status   Specimen Description BLOOD RIGHT SUBCLAVIAN   Final   Special Requests BOTTLES DRAWN AEROBIC AND ANAEROBIC 5CC   Final   Culture  Setup Time     Final   Value: 07/10/2013 17:40     Performed at [facility]   Culture     Final   Value: STAPHYLOCOCCUS SPECIES (COAGULASE NEGATIVE)     Note: RIFAMPIN AND GENTAMICIN SHOULD NOT BE USED AS SINGLE DRUGS FOR TREATMENT OF STAPH INFECTIONS.     Note: Gram Stain Report Called to,Read Back By and Verified With: Charlsie Merles 07/11/13 1515 BY [email or web address]     Performed at [facility]   Report Status 07/14/2013 FINAL   Final   Organism ID, Bacteria STAPHYLOCOCCUS SPECIES (COAGULASE NEGATIVE)   Final  URINE CULTURE     Status: None   Collection Time    07/10/13  3:29 PM      Result Value Range Status   Specimen Description URINE, CATHETERIZED   Final   Special Requests Normal   Final   Culture  Setup Time     Final   Value: 07/10/2013 20:28     Performed at [facility]   Colony Count     Final   Value: NO GROWTH     Performed at [facility]   Culture     Final   Value: NO GROWTH     Performed at [facility]   Report Status 07/11/2013 FINAL   Final  MRSA PCR SCREENING     Status: None   Collection Time    07/10/13  3:52 PM      Result Value Range Status   MRSA by PCR NEGATIVE  NEGATIVE Final    Comment:            The GeneXpert MRSA Assay (FDA     approved for NASAL specimens     only), is one component of a     comprehensive MRSA colonization     surveillance program. It is not     intended to diagnose MRSA     infection nor to guide or     monitor treatment for     MRSA infections.  CULTURE, RESPIRATORY (NON-EXPECTORATED)     Status: None   Collection Time    07/11/13 11:49 PM      Result Value Range Status   Specimen Description TRACHEAL ASPIRATE   Final   Special Requests NONE   Final   Gram Stain PENDING   Incomplete   Culture     Final   Value: ABUNDANT CANDIDA ALBICANS     Performed at [facility]   Report Status PENDING   Incomplete    Radiology Reports Ct Head Wo Contrast  07/10/2013   CLINICAL DATA:  Altered mental status. Hypotension, fever, ST-elevation, and respiratory failure following witnessed cardiac arrest.  EXAM: CT HEAD WITHOUT CONTRAST  TECHNIQUE: Contiguous axial images were obtained from the base of the skull through the vertex without intravenous contrast.  COMPARISON:  [facility]rain MRI from [facility] 05/16/2010  FINDINGS: There is advanced cerebral atrophy for age with particular prominence of the ventricles. Multiple periventricular hypodensities are seen, corresponding to the T2 hyperintense lesions described on prior MRI. There is no evidence of acute large territory infarct, mass,  midline shift, intracranial hemorrhage, or extra-axial fluid collection. Visualized paranasal sinuses and mastoid air cells are clear. Orbits are unremarkable. No acute fracture identified.  IMPRESSION: 1. No acute intracranial abnormality identified. 2. Periventricular white matter hypodensities and cerebral atrophy, consistent with history of multiple sclerosis.   Electronically Signed   By: Sebastian Ache   On: 07/10/2013 14:57   Ct Abdomen Pelvis W Contrast  06/28/2013   CLINICAL DATA:  Code sepsis, fever  EXAM: CT ABDOMEN AND PELVIS WITH CONTRAST  TECHNIQUE:  Multidetector CT imaging of the abdomen and pelvis was performed using the standard protocol following bolus administration of intravenous contrast.  CONTRAST:  80mL OMNIPAQUE IOHEXOL 300 MG/ML  SOLN  COMPARISON:  Prior CT abdomen/pelvis 01/04/2011  FINDINGS: Lower Chest: Patchy ground-glass attenuation airspace disease and early consolidative changes in the dependent right lower lobe. Visualized cardiac structures within normal limits for size. No pericardial effusion. Unremarkable distal thoracic esophagus.  Abdomen: Unremarkable CT appearance of the stomach, duodenum, spleen, adrenal glands and pancreas. Normal hepatic contour and morphology. Gallbladder is unremarkable. No intra or extrahepatic biliary ductal dilatation.  Unremarkable appearance of the bilateral kidneys. No focal solid lesion, hydronephrosis or nephrolithiasis. Simple cyst in the lower pole of the left kidney.  No evidence of obstruction or focal bowel wall thickening. Normal appendix in the right lower quadrant. The terminal ileum is unremarkable. Large rectal stool ball. Colonic diverticular disease without CT evidence of active inflammation. No free fluid.  Pelvis: Unremarkable appearance of the bladder. Air and fluid noted within the upper vagina. Minimally complex low-attenuation right ovarian cyst versus 2 smaller adjacent cysts. It measured is a single cyst, the maximal diameter is 3.6 cm. If measured individually, the larger cyst measures 2.8 cm. No definite free fluid or suspicious adenopathy.  Bones/Soft Tissues: No acute fracture or aggressive appearing lytic or blastic osseous lesion.  Vascular: No significant atherosclerotic vascular disease, aneurysmal dilatation or acute abnormality.  IMPRESSION: 1. Patchy ground-glass attenuation opacity and early consolidative changes in the dependent right lower lobe concerning for small volume aspiration versus early bronchopneumonia. 2. Large rectal stool ball concerning for  constipation/impaction. 3. Air and fluid noted within the upper vagina. This is of uncertain etiology and clinical significance. 4. Minimally complex versus adjacent simple right ovarian cysts. If the patient is not menopausal, this is almost certainly benign and requires no follow-up.   Electronically Signed   By: Malachy Moan M.D.   On: 06/28/2013 21:07   Dg Chest Port 1 View  07/11/2013   CLINICAL DATA:  Feeding tube placement  EXAM: PORTABLE CHEST - 1 VIEW  COMPARISON:  Prior chest x-ray 06/30/2013  FINDINGS: The tip of the endotracheal tube is 1 cm above the carina. The right IJ central venous catheter is in unchanged position with the tip in the distal SVC. A new enteric feeding tube is present. The tip is incompletely imaged and is likely within the stomach. The nasogastric tube has been removed. Mild bibasilar atelectasis. Otherwise, the lungs are clear. Cardiac and mediastinal contours are within normal limits. No pleural effusion or pneumothorax.  IMPRESSION: 1. The tip of the endotracheal tube is 1 cm above the carina. Consider withdrawing 2 cm for more optimal placement. 2. Interval placement of an enteric feeding tube. The tip is incompletely visualized at the bottom of the film and is likely still within the stomach consider abdominal radiograph to confirm location.   Electronically Signed   By: Malachy Moan M.D.   On: 07/11/2013 15:33  Dg Chest Portable 1 View  07/10/2013   CLINICAL DATA:  ET tube placement  EXAM: PORTABLE CHEST - 1 VIEW  COMPARISON:  07/10/2013  FINDINGS: Cardiomediastinal silhouette is stable. Endotracheal tube in place. NG tube in mid esophagus. The NG tube should be advanced into the stomach. No diagnostic pneumothorax. Right IJ central with tip in SVC.  IMPRESSION: Endotracheal tube in place. NG tube with tip in mid esophagus. The NG tube should be advanced into the stomach. No diagnostic pneumothorax.  These results were called by telephone at the time of  interpretation on 07/10/2013 at 12:12 PM to Dr. Jaci Carrel , who verbally acknowledged these results.   Electronically Signed   By: Natasha Mead M.D.   On: 07/10/2013 12:12   Dg Chest Portable 1 View  07/10/2013   CLINICAL DATA:  ET tube placement. Multiple sclerosis. Respiratory failure.  EXAM: PORTABLE CHEST - 1 VIEW  COMPARISON:  06/28/2013.  FINDINGS: Normal heart size.  Clear lung fields.  No bony abnormality.  Endotracheal tube has been placed and lies 2.2 cm above carina. Could be pulled back slightly, 2-3 cm for optimal placement. No pneumothorax or effusion.  IMPRESSION: ET tube 2.2 cm above carina. Pullback slightly 2-3 cm for optimal placement. No active cardiopulmonary disease.   Electronically Signed   By: Davonna Belling M.D.   On: 07/10/2013 10:57   Dg Chest Port 1 View  06/28/2013   CLINICAL DATA:  Code sepsis  EXAM: PORTABLE CHEST - 1 VIEW  COMPARISON:  None.  FINDINGS: The heart size and mediastinal contours are within normal limits. Lungs are hyperinflated and there are coarsened interstitial markings noted bilaterally. No airspace consolidation. The visualized skeletal structures are unremarkable.  IMPRESSION: 1. No acute cardiopulmonary abnormalities.   Electronically Signed   By: Signa Kell M.D.   On: 06/28/2013 14:57    CBC  Recent Labs Lab 07/10/13 1042 07/10/13 1408 07/11/13 0415 07/11/13 1223 07/12/13 0930 07/13/13 0410  WBC 12.2* 27.7* 29.2*  --  31.8* 35.3*  HGB 12.8 11.1* 11.6*  --  9.1* 9.1*  HCT 41.1 35.3* 35.3*  --  26.1* 26.4*  PLT 170 132* 81* 61* 23* 26*  MCV 94.3 93.9 89.1  --  84.5 84.9  MCH 29.4 29.5 29.3  --  29.4 29.3  MCHC 31.1 31.4 32.9  --  34.9 34.5  RDW 17.3* 17.3* 16.7*  --  16.3* 16.1*  LYMPHSABS 2.2  --   --   --   --   --   MONOABS 0.6  --   --   --   --   --   EOSABS 0.0  --   --   --   --   --   BASOSABS 0.0  --   --   --   --   --     Chemistries   Recent Labs Lab 07/10/13 1042 07/10/13 1408 07/10/13 1710  07/11/13 0415 07/12/13 0930 07/13/13 0410  NA 163* 161* 156* 159* 150* 153*  K 4.3 3.4* 2.3* 2.9* 3.7 4.3  CL 121* 122* 128* 122* 106 107  CO2 7* 13* 10* 15* 27 27  GLUCOSE 423* 342* 329* 180* 156* 100*  BUN 55* 48* 40* 56* 65* 74*  CREATININE 2.40* 1.24* 1.50* 2.52* 3.50* 4.28*  CALCIUM 8.8 6.4* 4.7* 6.4* 5.8* 5.9*  MG  --  2.1  --   --   --   --   AST 2568* 2963*  --   --  702*  --   ALT 1123* 1327*  --   --  698*  --   ALKPHOS 111 96  --   --  78  --   BILITOT 1.1 1.1  --   --  1.6*  --    ------------------------------------------------------------------------------------------------------------------ estimated creatinine clearance is 13.9 ml/min (by C-G formula based on Cr of 4.28). ------------------------------------------------------------------------------------------------------------------ No results found for this basename: HGBA1C,  in the last 72 hours ------------------------------------------------------------------------------------------------------------------ No results found for this basename: CHOL, HDL, LDLCALC, TRIG, CHOLHDL, LDLDIRECT,  in the last 72 hours ------------------------------------------------------------------------------------------------------------------ No results found for this basename: TSH, T4TOTAL, FREET3, T3FREE, THYROIDAB,  in the last 72 hours ------------------------------------------------------------------------------------------------------------------ No results found for this basename: VITAMINB12, FOLATE, FERRITIN, TIBC, IRON, RETICCTPCT,  in the last 72 hours  Coagulation profile  Recent Labs Lab 07/10/13 1408 07/11/13 1223 07/12/13 0930 07/13/13 0410  INR 3.94* 3.04* 1.94* 1.40     Recent Labs  07/11/13 1223  DDIMER >20.00*    Cardiac Enzymes  Recent Labs Lab 07/11/13 1230  TROPONINI >20.00*    ------------------------------------------------------------------------------------------------------------------ No components found with this basename: POCBNP,

## 2013-07-15 DIAGNOSIS — Z515 Encounter for palliative care: Secondary | ICD-10-CM

## 2013-07-15 DIAGNOSIS — Z66 Do not resuscitate: Secondary | ICD-10-CM

## 2013-07-15 NOTE — Progress Notes (Signed)
[facility]                                                                                Patient Demographics  Francisca Langenderfer, is a 52 y.o. female, DOB - 09/23/1960, ZOX:[id number]  Admit date - 07/10/2013   Admitting Physician Oretha Milch, MD  Outpatient Primary MD for the patient is Provider Not In System  LOS - 5   Chief Complaint  Patient presents with  . Cardiac Arrest      Interim history 52 year old with advanced MS, bedbound, nonverbal, requiring assistance for all of her daily activities was found on [address] by the Sister. Patient required CPR was found to have PEA arrest with downtime of 15-20 minutes. Seen by palliative care and made comfort measures on 12/20.  Assessment & Plan    Active Problems:   MS (multiple sclerosis)   Acute MI   Shock   Acidosis, metabolic   Organic brain syndrome   Protein-calorie malnutrition, severe  STEMI with cardiogenic and septic shock -Patient did require Levophed GTT -She was made DO NOT RESUSCITATE  Status post Cardiac arrest -Resolved  Acute respiratory failure in setting of cardiac arrest -Resolved -Currently do not reintubate  Acute renal failure with metabolic acidosis and hypernatremia -Likely secondary to shock -Creatinine 4.28, acidosis resolved -Acidosis resolved  Possible ischemic bowel with shocked liver/acute liver failure -Patient has had bloody bowel movements. -Currently n.p.o. -Continue Protonix -Likely no intervention, discussion with palliative care  Severe protein calorie malnutrition -Currently n.p.o. May consider tube feeds after discussion with palliative care  Thrombocytopenia with coagulation with possible DIC -Secondary to shock  Bacteremia with gram-positive cocci clusters -Currently on vancomycin with pharmacy monitoring -Blood cultures returned Staphylococcus species coagulase-negative  Hypoglycemia with possible adrenal insufficiency -Hypoglycemia resolved  Acute  encephalopathy with anoxic brain injury -No intervention at this time  Advanced multiple sclerosis -No intervention at this time  Code Status: DNR/Comfort measures only  Family Communication: None at this time.   Disposition Plan: Admitted. Patient is now comfort measures only.  Continue pain control and and valium as needed for comfort.   Procedures/Course 12/16 Admitted after cardiac arrest, made limited code  12/16 Head CT >>> nad, changes consistent with MS  12/17 TTE >>> LVH, EF 65%, diastolic dysfunction, small effusion without tamponade  12/17 Heparin gtt started as Troponin>20, but stopped after bloody bowel movement  12/18 Goals of care meeting with daughters ( medical POAs) >>> DNR, extubate when ready, comfort if deteriorates  12/18 Terminally extubate   Consults   PCCM Palliative Care Cardiology  DVT Prophylaxis None   Lab Results  Component Value Date   PLT 26* 07/13/2013    Medications  Scheduled Meds: . chlorhexidine  15 mL Mouth/Throat BID  . diazepam  2.5 mg Intravenous QHS  . hydrocortisone sod succinate (SOLU-CORTEF) inj  50 mg Intravenous Q12H  . sodium chloride  10-40 mL Intracatheter Q12H   Continuous Infusions: . sodium chloride 10 mL/hr (07/14/13 1110)  . HYDROmorphone 0.25 mg/hr (07/14/13 1553)   PRN Meds:.atropine, diazepam, HYDROmorphone, sodium chloride  Antibiotics    Anti-infectives   Start     Dose/Rate Route Frequency Ordered Stop  07/11/13 1200  piperacillin-tazobactam (ZOSYN) IVPB 2.25 g  Status:  Discontinued     2.25 g 100 mL/hr over 30 Minutes Intravenous 4 times per day 07/11/13 0901 07/14/13 1031   07/10/13 2200  piperacillin-tazobactam (ZOSYN) IVPB 3.375 g  Status:  Discontinued     3.375 g 12.5 mL/hr over 240 Minutes Intravenous 3 times per day 07/10/13 1324 07/11/13 0901   07/10/13 1330  vancomycin (VANCOCIN) 1,750 mg in sodium chloride 0.9 % 500 mL IVPB     1,750 mg 250 mL/hr over 120 Minutes Intravenous STAT  07/10/13 1311 07/10/13 1730   07/10/13 1230  piperacillin-tazobactam (ZOSYN) IVPB 3.375 g     3.375 g 12.5 mL/hr over 240 Minutes Intravenous  Once 07/10/13 1229 07/10/13 1643      Time Spent in minutes   20 minutes   Leshawn Houseworth D.O. on 07/15/2013 at 10:26 AM  Between 7am to 7pm - Pager - [phone]  After 7pm go to [email or web address] - password [email or web address]  And look for the night coverage person covering for me after hours  [facility] Office  [phone]    Subjective:   Merie Wulf seen and examined today.   Patient nonverbal.  Objective:   Filed Vitals:   07/14/13 0416 07/14/13 0753 07/14/13 1149 07/15/13 0900  BP: 97/65 95/66 97/59  96/62  Pulse: 74 74 78 76  Temp: 97.8 F (36.6 C) 98.4 F (36.9 C) 98.6 F (37 C) 98.1 F (36.7 C)  TempSrc: Oral Oral Oral Oral  Resp: 15 17 16 18   Height:      Weight:      SpO2: 100% 100% 100% 100%    Wt Readings from Last 3 Encounters:  07/13/13 55.883 kg (123 lb 3.2 oz)  06/29/13 99.5 kg (219 lb 5.7 oz)  01/19/11 54.795 kg (120 lb 12.8 oz)     Intake/Output Summary (Last 24 hours) at 07/15/13 1026 Last data filed at 07/15/13 0900  Gross per 24 hour  Intake      0 ml  Output     25 ml  Net    -25 ml    Exam  General: Well developed, NAD  HEENT: NCAT  Neck: Supple, no JVD, no masses  Cardiovascular: S1 S2 auscultated  Respiratory: Coarse breath sounds  Abdomen: Soft, nontender, nondistended  Extremities: warm dry without cyanosis clubbing. + Edema in the lower extremities  Neuro: Unable to assess  Skin: Warm, dry  Psych: Unable to assess   Data Review   Micro Results Recent Results (from the past 240 hour(s))  CULTURE, BLOOD (ROUTINE X 2)     Status: None   Collection Time    07/10/13 11:15 AM      Result Value Range Status   Specimen Description BLOOD ARM LEFT   Final   Special Requests BOTTLES DRAWN AEROBIC ONLY 0.5CC   Final   Culture  Setup Time     Final   Value: 07/10/2013  17:40     Performed at [facility]   Culture     Final   Value: STAPHYLOCOCCUS SPECIES (COAGULASE NEGATIVE)     Note: SUSCEPTIBILITIES PERFORMED ON PREVIOUS CULTURE WITHIN THE LAST 5 DAYS.     Note: Gram Stain Report Called to,Read Back By and Verified With: GAIL TURNER ON 07/11/2013 AT 10:57P BY Serafina Mitchell     Performed at [facility]   Report Status 07/14/2013 FINAL   Final  CULTURE, BLOOD (ROUTINE X 2)     Status: None  Collection Time    07/10/13 11:45 AM      Result Value Range Status   Specimen Description BLOOD RIGHT SUBCLAVIAN   Final   Special Requests BOTTLES DRAWN AEROBIC AND ANAEROBIC 5CC   Final   Culture  Setup Time     Final   Value: 07/10/2013 17:40     Performed at [facility]   Culture     Final   Value: STAPHYLOCOCCUS SPECIES (COAGULASE NEGATIVE)     Note: RIFAMPIN AND GENTAMICIN SHOULD NOT BE USED AS SINGLE DRUGS FOR TREATMENT OF STAPH INFECTIONS.     Note: Gram Stain Report Called to,Read Back By and Verified With: Charlsie Merles 07/11/13 1515 BY [email or web address]     Performed at [facility]   Report Status 07/14/2013 FINAL   Final   Organism ID, Bacteria STAPHYLOCOCCUS SPECIES (COAGULASE NEGATIVE)   Final  URINE CULTURE     Status: None   Collection Time    07/10/13  3:29 PM      Result Value Range Status   Specimen Description URINE, CATHETERIZED   Final   Special Requests Normal   Final   Culture  Setup Time     Final   Value: 07/10/2013 20:28     Performed at [facility]   Colony Count     Final   Value: NO GROWTH     Performed at [facility]   Culture     Final   Value: NO GROWTH     Performed at [facility]   Report Status 07/11/2013 FINAL   Final  MRSA PCR SCREENING     Status: None   Collection Time    07/10/13  3:52 PM      Result Value Range Status   MRSA by PCR NEGATIVE  NEGATIVE Final   Comment:            The GeneXpert MRSA Assay (FDA     approved for NASAL specimens     only), is one  component of a     comprehensive MRSA colonization     surveillance program. It is not     intended to diagnose MRSA     infection nor to guide or     monitor treatment for     MRSA infections.  CULTURE, RESPIRATORY (NON-EXPECTORATED)     Status: None   Collection Time    07/11/13 11:49 PM      Result Value Range Status   Specimen Description TRACHEAL ASPIRATE   Final   Special Requests NONE   Final   Gram Stain PENDING   Incomplete   Culture     Final   Value: ABUNDANT CANDIDA ALBICANS     Performed at [facility]   Report Status PENDING   Incomplete    Radiology Reports Ct Head Wo Contrast  07/10/2013   CLINICAL DATA:  Altered mental status. Hypotension, fever, ST-elevation, and respiratory failure following witnessed cardiac arrest.  EXAM: CT HEAD WITHOUT CONTRAST  TECHNIQUE: Contiguous axial images were obtained from the base of the skull through the vertex without intravenous contrast.  COMPARISON:  Brain MRI from [facility] 05/16/2010  FINDINGS: There is advanced cerebral atrophy for age with particular prominence of the ventricles. Multiple periventricular hypodensities are seen, corresponding to the T2 hyperintense lesions described on prior MRI. There is no evidence of acute large territory infarct, mass, midline shift, intracranial hemorrhage, or extra-axial fluid collection. Visualized paranasal sinuses  and mastoid air cells are clear. Orbits are unremarkable. No acute fracture identified.  IMPRESSION: 1. No acute intracranial abnormality identified. 2. Periventricular white matter hypodensities and cerebral atrophy, consistent with history of multiple sclerosis.   Electronically Signed   By: Sebastian Ache   On: 07/10/2013 14:57   Ct Abdomen Pelvis W Contrast  06/28/2013   CLINICAL DATA:  Code sepsis, fever  EXAM: CT ABDOMEN AND PELVIS WITH CONTRAST  TECHNIQUE: Multidetector CT imaging of the abdomen and pelvis was performed using the standard protocol following  bolus administration of intravenous contrast.  CONTRAST:  80mL OMNIPAQUE IOHEXOL 300 MG/ML  SOLN  COMPARISON:  Prior CT abdomen/pelvis 01/04/2011  FINDINGS: Lower Chest: Patchy ground-glass attenuation airspace disease and early consolidative changes in the dependent right lower lobe. Visualized cardiac structures within normal limits for size. No pericardial effusion. Unremarkable distal thoracic esophagus.  Abdomen: Unremarkable CT appearance of the stomach, duodenum, spleen, adrenal glands and pancreas. Normal hepatic contour and morphology. Gallbladder is unremarkable. No intra or extrahepatic biliary ductal dilatation.  Unremarkable appearance of the bilateral kidneys. No focal solid lesion, hydronephrosis or nephrolithiasis. Simple cyst in the lower pole of the left kidney.  No evidence of obstruction or focal bowel wall thickening. Normal appendix in the right lower quadrant. The terminal ileum is unremarkable. Large rectal stool ball. Colonic diverticular disease without CT evidence of active inflammation. No free fluid.  Pelvis: Unremarkable appearance of the bladder. Air and fluid noted within the upper vagina. Minimally complex low-attenuation right ovarian cyst versus 2 smaller adjacent cysts. It measured is a single cyst, the maximal diameter is 3.6 cm. If measured individually, the larger cyst measures 2.8 cm. No definite free fluid or suspicious adenopathy.  Bones/Soft Tissues: No acute fracture or aggressive appearing lytic or blastic osseous lesion.  Vascular: No significant atherosclerotic vascular disease, aneurysmal dilatation or acute abnormality.  IMPRESSION: 1. Patchy ground-glass attenuation opacity and early consolidative changes in the dependent right lower lobe concerning for small volume aspiration versus early bronchopneumonia. 2. Large rectal stool ball concerning for constipation/impaction. 3. Air and fluid noted within the upper vagina. This is of uncertain etiology and clinical  significance. 4. Minimally complex versus adjacent simple right ovarian cysts. If the patient is not menopausal, this is almost certainly benign and requires no follow-up.   Electronically Signed   By: Malachy Moan M.D.   On: 06/28/2013 21:07   Dg Chest Port 1 View  07/11/2013   CLINICAL DATA:  Feeding tube placement  EXAM: PORTABLE CHEST - 1 VIEW  COMPARISON:  Prior chest x-ray 06/30/2013  FINDINGS: The tip of the endotracheal tube is 1 cm above the carina. The right IJ central venous catheter is in unchanged position with the tip in the distal SVC. A new enteric feeding tube is present. The tip is incompletely imaged and is likely within the stomach. The nasogastric tube has been removed. Mild bibasilar atelectasis. Otherwise, the lungs are clear. Cardiac and mediastinal contours are within normal limits. No pleural effusion or pneumothorax.  IMPRESSION: 1. The tip of the endotracheal tube is 1 cm above the carina. Consider withdrawing 2 cm for more optimal placement. 2. Interval placement of an enteric feeding tube. The tip is incompletely visualized at the bottom of the film and is likely still within the stomach consider abdominal radiograph to confirm location.   Electronically Signed   By: Malachy Moan M.D.   On: 07/11/2013 15:33   Dg Chest Portable 1 View  07/10/2013  CLINICAL DATA:  ET tube placement  EXAM: PORTABLE CHEST - 1 VIEW  COMPARISON:  07/10/2013  FINDINGS: Cardiomediastinal silhouette is stable. Endotracheal tube in place. NG tube in mid esophagus. The NG tube should be advanced into the stomach. No diagnostic pneumothorax. Right IJ central with tip in SVC.  IMPRESSION: Endotracheal tube in place. NG tube with tip in mid esophagus. The NG tube should be advanced into the stomach. No diagnostic pneumothorax.  These results were called by telephone at the time of interpretation on 07/10/2013 at 12:12 PM to Dr. Jaci Carrel , who verbally acknowledged these results.    Electronically Signed   By: Natasha Mead M.D.   On: 07/10/2013 12:12   Dg Chest Portable 1 View  07/10/2013   CLINICAL DATA:  ET tube placement. Multiple sclerosis. Respiratory failure.  EXAM: PORTABLE CHEST - 1 VIEW  COMPARISON:  06/28/2013.  FINDINGS: Normal heart size.  Clear lung fields.  No bony abnormality.  Endotracheal tube has been placed and lies 2.2 cm above carina. Could be pulled back slightly, 2-3 cm for optimal placement. No pneumothorax or effusion.  IMPRESSION: ET tube 2.2 cm above carina. Pullback slightly 2-3 cm for optimal placement. No active cardiopulmonary disease.   Electronically Signed   By: Davonna Belling M.D.   On: 07/10/2013 10:57   Dg Chest Port 1 View  06/28/2013   CLINICAL DATA:  Code sepsis  EXAM: PORTABLE CHEST - 1 VIEW  COMPARISON:  None.  FINDINGS: The heart size and mediastinal contours are within normal limits. Lungs are hyperinflated and there are coarsened interstitial markings noted bilaterally. No airspace consolidation. The visualized skeletal structures are unremarkable.  IMPRESSION: 1. No acute cardiopulmonary abnormalities.   Electronically Signed   By: Signa Kell M.D.   On: 06/28/2013 14:57    CBC  Recent Labs Lab 07/10/13 1042 07/10/13 1408 07/11/13 0415 07/11/13 1223 07/12/13 0930 07/13/13 0410  WBC 12.2* 27.7* 29.2*  --  31.8* 35.3*  HGB 12.8 11.1* 11.6*  --  9.1* 9.1*  HCT 41.1 35.3* 35.3*  --  26.1* 26.4*  PLT 170 132* 81* 61* 23* 26*  MCV 94.3 93.9 89.1  --  84.5 84.9  MCH 29.4 29.5 29.3  --  29.4 29.3  MCHC 31.1 31.4 32.9  --  34.9 34.5  RDW 17.3* 17.3* 16.7*  --  16.3* 16.1*  LYMPHSABS 2.2  --   --   --   --   --   MONOABS 0.6  --   --   --   --   --   EOSABS 0.0  --   --   --   --   --   BASOSABS 0.0  --   --   --   --   --     Chemistries   Recent Labs Lab 07/10/13 1042 07/10/13 1408 07/10/13 1710 07/11/13 0415 07/12/13 0930 07/13/13 0410  NA 163* 161* 156* 159* 150* 153*  K 4.3 3.4* 2.3* 2.9* 3.7 4.3  CL 121* 122*  128* 122* 106 107  CO2 7* 13* 10* 15* 27 27  GLUCOSE 423* 342* 329* 180* 156* 100*  BUN 55* 48* 40* 56* 65* 74*  CREATININE 2.40* 1.24* 1.50* 2.52* 3.50* 4.28*  CALCIUM 8.8 6.4* 4.7* 6.4* 5.8* 5.9*  MG  --  2.1  --   --   --   --   AST 2568* 2963*  --   --  702*  --   ALT 1123* 1327*  --   --  698*  --   ALKPHOS 111 96  --   --  78  --   BILITOT 1.1 1.1  --   --  1.6*  --    ------------------------------------------------------------------------------------------------------------------ estimated creatinine clearance is 13.9 ml/min (by C-G formula based on Cr of 4.28). ------------------------------------------------------------------------------------------------------------------ No results found for this basename: HGBA1C,  in the last 72 hours ------------------------------------------------------------------------------------------------------------------ No results found for this basename: CHOL, HDL, LDLCALC, TRIG, CHOLHDL, LDLDIRECT,  in the last 72 hours ------------------------------------------------------------------------------------------------------------------ No results found for this basename: TSH, T4TOTAL, FREET3, T3FREE, THYROIDAB,  in the last 72 hours ------------------------------------------------------------------------------------------------------------------ No results found for this basename: VITAMINB12, FOLATE, FERRITIN, TIBC, IRON, RETICCTPCT,  in the last 72 hours  Coagulation profile  Recent Labs Lab 07/10/13 1408 07/11/13 1223 07/12/13 0930 07/13/13 0410  INR 3.94* 3.04* 1.94* 1.40    No results found for this basename: DDIMER,  in the last 72 hours  Cardiac Enzymes  Recent Labs Lab 07/11/13 1230  TROPONINI >20.00*   ------------------------------------------------------------------------------------------------------------------ No components found with this basename: POCBNP,

## 2013-07-16 DIAGNOSIS — Z515 Encounter for palliative care: Secondary | ICD-10-CM

## 2013-07-16 MED ORDER — WHITE PETROLATUM GEL
Status: AC
  Administered 2013-07-17: 12:00:00
  Filled 2013-07-16: qty 5

## 2013-07-16 MED FILL — Medication: Qty: 1 | Status: AC

## 2013-07-16 NOTE — Progress Notes (Signed)
[facility]                                                                                Patient Demographics  Roberta Carpenter, is a 52 y.o. female, DOB - [birth year], HYQ:[id number]  Admit date - 07/10/2013   Admitting Physician Oretha Milch, MD  Outpatient Primary MD for the patient is Provider Not In System  LOS - 6   Chief Complaint  Patient presents with  . Cardiac Arrest      Interim history 52 year old with advanced MS, bedbound, nonverbal, requiring assistance for all of her daily activities was found on [address] by the Sister. Patient required CPR was found to have PEA arrest with downtime of 15-20 minutes. Seen by palliative care and made comfort measures on 12/20.  Assessment & Plan    Active Problems:   MS (multiple sclerosis)   Acute MI   Shock   Acidosis, metabolic   Organic brain syndrome   Protein-calorie malnutrition, severe  STEMI with cardiogenic and septic shock -Patient did require Levophed GTT -She was made DO NOT RESUSCITATE  Status post Cardiac arrest -Resolved  Acute respiratory failure in setting of cardiac arrest -Resolved -Currently do not reintubate  Acute renal failure with metabolic acidosis and hypernatremia -Likely secondary to shock -Creatinine 4.28, acidosis resolved -Acidosis resolved  Possible ischemic bowel with shocked liver/acute liver failure -Patient has had bloody bowel movements. -Currently n.p.o. -Continue Protonix -Likely no intervention, discussion with palliative care  Severe protein calorie malnutrition -Currently n.p.o. May consider tube feeds after discussion with palliative care  Thrombocytopenia with coagulation with possible DIC -Secondary to shock  Bacteremia with gram-positive cocci clusters -Currently on vancomycin with pharmacy monitoring -Blood cultures returned Staphylococcus species coagulase-negative  Hypoglycemia with possible adrenal insufficiency -Hypoglycemia resolved  Acute  encephalopathy with anoxic brain injury -No intervention at this time  Advanced multiple sclerosis -No intervention at this time  Code Status: DNR/Comfort measures only  Family Communication: None at this time.   Disposition Plan: Admitted. Patient is now comfort measures only.  Continue pain control and and valium as needed for comfort.  Will see if patient can be transferred to palliative care.  Procedures/Course 12/16 Admitted after cardiac arrest, made limited code  12/16 Head CT >>> nad, changes consistent with MS  12/17 TTE >>> LVH, EF 65%, diastolic dysfunction, small effusion without tamponade  12/17 Heparin gtt started as Troponin>20, but stopped after bloody bowel movement  12/18 Goals of care meeting with daughters ( medical POAs) >>> DNR, extubate when ready, comfort if deteriorates  12/18 Terminally extubate   Consults   PCCM Palliative Care Cardiology  DVT Prophylaxis None   Lab Results  Component Value Date   PLT 26* 07/13/2013    Medications  Scheduled Meds: . chlorhexidine  15 mL Mouth/Throat BID  . diazepam  2.5 mg Intravenous QHS  . hydrocortisone sod succinate (SOLU-CORTEF) inj  50 mg Intravenous Q12H  . sodium chloride  10-40 mL Intracatheter Q12H   Continuous Infusions: . sodium chloride 10 mL/hr (07/14/13 1110)  . HYDROmorphone 0.25 mg/hr (07/16/13 1105)   PRN Meds:.atropine, diazepam, HYDROmorphone, sodium chloride  Antibiotics    Anti-infectives  Start     Dose/Rate Route Frequency Ordered Stop   07/11/13 1200  piperacillin-tazobactam (ZOSYN) IVPB 2.25 g  Status:  Discontinued     2.25 g 100 mL/hr over 30 Minutes Intravenous 4 times per day 07/11/13 0901 07/14/13 1031   07/10/13 2200  piperacillin-tazobactam (ZOSYN) IVPB 3.375 g  Status:  Discontinued     3.375 g 12.5 mL/hr over 240 Minutes Intravenous 3 times per day 07/10/13 1324 07/11/13 0901   07/10/13 1330  vancomycin (VANCOCIN) 1,750 mg in sodium chloride 0.9 % 500 mL IVPB      1,750 mg 250 mL/hr over 120 Minutes Intravenous STAT 07/10/13 1311 07/10/13 1730   07/10/13 1230  piperacillin-tazobactam (ZOSYN) IVPB 3.375 g     3.375 g 12.5 mL/hr over 240 Minutes Intravenous  Once 07/10/13 1229 07/10/13 1643      Time Spent in minutes   20 minutes   Sela Falk D.O. on 07/16/2013 at 4:37 PM  Between 7am to 7pm - Pager - [phone]  After 7pm go to [email or web address] - password [email or web address]  And look for the night coverage person covering for me after hours  [facility] Office  [phone]    Subjective:   Roberta Carpenter seen and examined today.   Patient nonverbal.  Objective:   Filed Vitals:   07/14/13 0416 07/14/13 0753 07/14/13 1149 07/15/13 0900  BP: 97/65 95/66 97/59  96/62  Pulse: 74 74 78 76  Temp: 97.8 F (36.6 C) 98.4 F (36.9 C) 98.6 F (37 C) 98.1 F (36.7 C)  TempSrc: Oral Oral Oral Oral  Resp: 15 17 16 18   Height:      Weight:      SpO2: 100% 100% 100% 100%    Wt Readings from Last 3 Encounters:  07/13/13 55.883 kg (123 lb 3.2 oz)  06/29/13 99.5 kg (219 lb 5.7 oz)  01/19/11 54.795 kg (120 lb 12.8 oz)     Intake/Output Summary (Last 24 hours) at 07/16/13 1637 Last data filed at 07/16/13 1300  Gross per 24 hour  Intake      0 ml  Output    200 ml  Net   -200 ml    Exam  General: Well developed, NAD  HEENT: NCAT  Neck: Supple, no JVD, no masses  Cardiovascular: S1 S2 auscultated  Respiratory: Coarse breath sounds  Abdomen: Soft, nontender, nondistended  Extremities: warm dry without cyanosis clubbing. + Edema in the lower extremities  Neuro: Unable to assess  Skin: Warm, dry  Psych: Unable to assess   Data Review   Micro Results Recent Results (from the past 240 hour(s))  CULTURE, BLOOD (ROUTINE X 2)     Status: None   Collection Time    07/10/13 11:15 AM      Result Value Range Status   Specimen Description BLOOD ARM LEFT   Final   Special Requests BOTTLES DRAWN AEROBIC ONLY 0.5CC   Final    Culture  Setup Time     Final   Value: 07/10/2013 17:40     Performed at [facility]   Culture     Final   Value: STAPHYLOCOCCUS SPECIES (COAGULASE NEGATIVE)     Note: SUSCEPTIBILITIES PERFORMED ON PREVIOUS CULTURE WITHIN THE LAST 5 DAYS.     Note: Gram Stain Report Called to,Read Back By and Verified With: GAIL TURNER ON 07/11/2013 AT 10:57P BY Serafina Mitchell     Performed at [facility]   Report Status 07/14/2013 FINAL   Final  CULTURE,  BLOOD (ROUTINE X 2)     Status: None   Collection Time    07/10/13 11:45 AM      Result Value Range Status   Specimen Description BLOOD RIGHT SUBCLAVIAN   Final   Special Requests BOTTLES DRAWN AEROBIC AND ANAEROBIC 5CC   Final   Culture  Setup Time     Final   Value: 07/10/2013 17:40     Performed at [facility]   Culture     Final   Value: STAPHYLOCOCCUS SPECIES (COAGULASE NEGATIVE)     Note: RIFAMPIN AND GENTAMICIN SHOULD NOT BE USED AS SINGLE DRUGS FOR TREATMENT OF STAPH INFECTIONS.     Note: Gram Stain Report Called to,Read Back By and Verified With: Charlsie Merles 07/11/13 1515 BY [email or web address]     Performed at [facility]   Report Status 07/14/2013 FINAL   Final   Organism ID, Bacteria STAPHYLOCOCCUS SPECIES (COAGULASE NEGATIVE)   Final  URINE CULTURE     Status: None   Collection Time    07/10/13  3:29 PM      Result Value Range Status   Specimen Description URINE, CATHETERIZED   Final   Special Requests Normal   Final   Culture  Setup Time     Final   Value: 07/10/2013 20:28     Performed at [facility]   Colony Count     Final   Value: NO GROWTH     Performed at [facility]   Culture     Final   Value: NO GROWTH     Performed at [facility]   Report Status 07/11/2013 FINAL   Final  MRSA PCR SCREENING     Status: None   Collection Time    07/10/13  3:52 PM      Result Value Range Status   MRSA by PCR NEGATIVE  NEGATIVE Final   Comment:            The GeneXpert MRSA Assay (FDA      approved for NASAL specimens     only), is one component of a     comprehensive MRSA colonization     surveillance program. It is not     intended to diagnose MRSA     infection nor to guide or     monitor treatment for     MRSA infections.  CULTURE, RESPIRATORY (NON-EXPECTORATED)     Status: None   Collection Time    07/11/13 11:49 PM      Result Value Range Status   Specimen Description TRACHEAL ASPIRATE   Final   Special Requests NONE   Final   Gram Stain PENDING   Incomplete   Culture     Final   Value: ABUNDANT CANDIDA ALBICANS     Performed at [facility]   Report Status PENDING   Incomplete    Radiology Reports Ct Head Wo Contrast  07/10/2013   CLINICAL DATA:  Altered mental status. Hypotension, fever, ST-elevation, and respiratory failure following witnessed cardiac arrest.  EXAM: CT HEAD WITHOUT CONTRAST  TECHNIQUE: Contiguous axial images were obtained from the base of the skull through the vertex without intravenous contrast.  COMPARISON:  Brain MRI from [facility] 05/16/2010  FINDINGS: There is advanced cerebral atrophy for age with particular prominence of the ventricles. Multiple periventricular hypodensities are seen, corresponding to the T2 hyperintense lesions described on prior MRI. There is no evidence of acute large territory infarct,  mass, midline shift, intracranial hemorrhage, or extra-axial fluid collection. Visualized paranasal sinuses and mastoid air cells are clear. Orbits are unremarkable. No acute fracture identified.  IMPRESSION: 1. No acute intracranial abnormality identified. 2. Periventricular white matter hypodensities and cerebral atrophy, consistent with history of multiple sclerosis.   Electronically Signed   By: Sebastian Ache   On: 07/10/2013 14:57   Ct Abdomen Pelvis W Contrast  06/28/2013   CLINICAL DATA:  Code sepsis, fever  EXAM: CT ABDOMEN AND PELVIS WITH CONTRAST  TECHNIQUE: Multidetector CT imaging of the abdomen and pelvis  was performed using the standard protocol following bolus administration of intravenous contrast.  CONTRAST:  80mL OMNIPAQUE IOHEXOL 300 MG/ML  SOLN  COMPARISON:  Prior CT abdomen/pelvis 01/04/2011  FINDINGS: Lower Chest: Patchy ground-glass attenuation airspace disease and early consolidative changes in the dependent right lower lobe. Visualized cardiac structures within normal limits for size. No pericardial effusion. Unremarkable distal thoracic esophagus.  Abdomen: Unremarkable CT appearance of the stomach, duodenum, spleen, adrenal glands and pancreas. Normal hepatic contour and morphology. Gallbladder is unremarkable. No intra or extrahepatic biliary ductal dilatation.  Unremarkable appearance of the bilateral kidneys. No focal solid lesion, hydronephrosis or nephrolithiasis. Simple cyst in the lower pole of the left kidney.  No evidence of obstruction or focal bowel wall thickening. Normal appendix in the right lower quadrant. The terminal ileum is unremarkable. Large rectal stool ball. Colonic diverticular disease without CT evidence of active inflammation. No free fluid.  Pelvis: Unremarkable appearance of the bladder. Air and fluid noted within the upper vagina. Minimally complex low-attenuation right ovarian cyst versus 2 smaller adjacent cysts. It measured is a single cyst, the maximal diameter is 3.6 cm. If measured individually, the larger cyst measures 2.8 cm. No definite free fluid or suspicious adenopathy.  Bones/Soft Tissues: No acute fracture or aggressive appearing lytic or blastic osseous lesion.  Vascular: No significant atherosclerotic vascular disease, aneurysmal dilatation or acute abnormality.  IMPRESSION: 1. Patchy ground-glass attenuation opacity and early consolidative changes in the dependent right lower lobe concerning for small volume aspiration versus early bronchopneumonia. 2. Large rectal stool ball concerning for constipation/impaction. 3. Air and fluid noted within the upper  vagina. This is of uncertain etiology and clinical significance. 4. Minimally complex versus adjacent simple right ovarian cysts. If the patient is not menopausal, this is almost certainly benign and requires no follow-up.   Electronically Signed   By: Malachy Moan M.D.   On: 06/28/2013 21:07   Dg Chest Port 1 View  07/11/2013   CLINICAL DATA:  Feeding tube placement  EXAM: PORTABLE CHEST - 1 VIEW  COMPARISON:  Prior chest x-ray 06/30/2013  FINDINGS: The tip of the endotracheal tube is 1 cm above the carina. The right IJ central venous catheter is in unchanged position with the tip in the distal SVC. A new enteric feeding tube is present. The tip is incompletely imaged and is likely within the stomach. The nasogastric tube has been removed. Mild bibasilar atelectasis. Otherwise, the lungs are clear. Cardiac and mediastinal contours are within normal limits. No pleural effusion or pneumothorax.  IMPRESSION: 1. The tip of the endotracheal tube is 1 cm above the carina. Consider withdrawing 2 cm for more optimal placement. 2. Interval placement of an enteric feeding tube. The tip is incompletely visualized at the bottom of the film and is likely still within the stomach consider abdominal radiograph to confirm location.   Electronically Signed   By: Malachy Moan M.D.   On: 07/11/2013  15:33   Dg Chest Portable 1 View  07/10/2013   CLINICAL DATA:  ET tube placement  EXAM: PORTABLE CHEST - 1 VIEW  COMPARISON:  07/10/2013  FINDINGS: Cardiomediastinal silhouette is stable. Endotracheal tube in place. NG tube in mid esophagus. The NG tube should be advanced into the stomach. No diagnostic pneumothorax. Right IJ central with tip in SVC.  IMPRESSION: Endotracheal tube in place. NG tube with tip in mid esophagus. The NG tube should be advanced into the stomach. No diagnostic pneumothorax.  These results were called by telephone at the time of interpretation on 07/10/2013 at 12:12 PM to Dr. Jaci Carrel  , who verbally acknowledged these results.   Electronically Signed   By: Natasha Mead M.D.   On: 07/10/2013 12:12   Dg Chest Portable 1 View  07/10/2013   CLINICAL DATA:  ET tube placement. Multiple sclerosis. Respiratory failure.  EXAM: PORTABLE CHEST - 1 VIEW  COMPARISON:  06/28/2013.  FINDINGS: Normal heart size.  Clear lung fields.  No bony abnormality.  Endotracheal tube has been placed and lies 2.2 cm above carina. Could be pulled back slightly, 2-3 cm for optimal placement. No pneumothorax or effusion.  IMPRESSION: ET tube 2.2 cm above carina. Pullback slightly 2-3 cm for optimal placement. No active cardiopulmonary disease.   Electronically Signed   By: Davonna Belling M.D.   On: 07/10/2013 10:57   Dg Chest Port 1 View  06/28/2013   CLINICAL DATA:  Code sepsis  EXAM: PORTABLE CHEST - 1 VIEW  COMPARISON:  None.  FINDINGS: The heart size and mediastinal contours are within normal limits. Lungs are hyperinflated and there are coarsened interstitial markings noted bilaterally. No airspace consolidation. The visualized skeletal structures are unremarkable.  IMPRESSION: 1. No acute cardiopulmonary abnormalities.   Electronically Signed   By: Signa Kell M.D.   On: 06/28/2013 14:57    CBC  Recent Labs Lab 07/10/13 1042 07/10/13 1408 07/11/13 0415 07/11/13 1223 07/12/13 0930 07/13/13 0410  WBC 12.2* 27.7* 29.2*  --  31.8* 35.3*  HGB 12.8 11.1* 11.6*  --  9.1* 9.1*  HCT 41.1 35.3* 35.3*  --  26.1* 26.4*  PLT 170 132* 81* 61* 23* 26*  MCV 94.3 93.9 89.1  --  84.5 84.9  MCH 29.4 29.5 29.3  --  29.4 29.3  MCHC 31.1 31.4 32.9  --  34.9 34.5  RDW 17.3* 17.3* 16.7*  --  16.3* 16.1*  LYMPHSABS 2.2  --   --   --   --   --   MONOABS 0.6  --   --   --   --   --   EOSABS 0.0  --   --   --   --   --   BASOSABS 0.0  --   --   --   --   --     Chemistries   Recent Labs Lab 07/10/13 1042 07/10/13 1408 07/10/13 1710 07/11/13 0415 07/12/13 0930 07/13/13 0410  NA 163* 161* 156* 159* 150* 153*   K 4.3 3.4* 2.3* 2.9* 3.7 4.3  CL 121* 122* 128* 122* 106 107  CO2 7* 13* 10* 15* 27 27  GLUCOSE 423* 342* 329* 180* 156* 100*  BUN 55* 48* 40* 56* 65* 74*  CREATININE 2.40* 1.24* 1.50* 2.52* 3.50* 4.28*  CALCIUM 8.8 6.4* 4.7* 6.4* 5.8* 5.9*  MG  --  2.1  --   --   --   --   AST 2568* 2963*  --   --  702*  --   ALT 1123* 1327*  --   --  698*  --   ALKPHOS 111 96  --   --  78  --   BILITOT 1.1 1.1  --   --  1.6*  --    ------------------------------------------------------------------------------------------------------------------ estimated creatinine clearance is 13.9 ml/min (by C-G formula based on Cr of 4.28). ------------------------------------------------------------------------------------------------------------------ No results found for this basename: HGBA1C,  in the last 72 hours ------------------------------------------------------------------------------------------------------------------ No results found for this basename: CHOL, HDL, LDLCALC, TRIG, CHOLHDL, LDLDIRECT,  in the last 72 hours ------------------------------------------------------------------------------------------------------------------ No results found for this basename: TSH, T4TOTAL, FREET3, T3FREE, THYROIDAB,  in the last 72 hours ------------------------------------------------------------------------------------------------------------------ No results found for this basename: VITAMINB12, FOLATE, FERRITIN, TIBC, IRON, RETICCTPCT,  in the last 72 hours  Coagulation profile  Recent Labs Lab 07/10/13 1408 07/11/13 1223 07/12/13 0930 07/13/13 0410  INR 3.94* 3.04* 1.94* 1.40    No results found for this basename: DDIMER,  in the last 72 hours  Cardiac Enzymes  Recent Labs Lab 07/11/13 1230  TROPONINI >20.00*   ------------------------------------------------------------------------------------------------------------------ No components found with this basename: POCBNP,

## 2013-07-16 NOTE — Progress Notes (Signed)
Progress Note from the Palliative Medicine Team at [facility]  Subjective: Spoke with family at bedside and they understand that she is dying and that her time is close. There was concern from a family member on the phone about not having visitation time after the patient passes before the funeral home is called. I reassured that the family should be notified prior to contacting the funeral home. They said that the chaplains have been visiting and do not need a spiritual care consult. They have no further questions/concerns at this time.   Spoke with nursing about utilizing the Dilaudid bolus for discomfort (i.e. Moaning) and to let us know if they are requiring frequent boluses.    Objective: No Known Allergies Scheduled Meds: . chlorhexidine  15 mL Mouth/Throat BID  . diazepam  2.5 mg Intravenous QHS  . hydrocortisone sod succinate (SOLU-CORTEF) inj  50 mg Intravenous Q12H  . sodium chloride  10-40 mL Intracatheter Q12H   Continuous Infusions: . sodium chloride 10 mL/hr (07/14/13 1110)  . HYDROmorphone 0.25 mg/hr (07/16/13 1105)   PRN Meds:.atropine, diazepam, HYDROmorphone, sodium chloride  BP 96/62  Pulse 76  Temp(Src) 98.1 F (36.7 C) (Oral)  Resp 18  Ht 5\' 8"  (1.727 m)  Wt 55.883 kg (123 lb 3.2 oz)  BMI 18.74 kg/m2  SpO2 100%   PPS:10%  Pain Score: nonverbal    Intake/Output Summary (Last 24 hours) at 07/16/13 1252 Last data filed at 07/15/13 2343  Gross per 24 hour  Intake      0 ml  Output    200 ml  Net   -200 ml      LBM: 07/14/13      Physical Exam:  General: NAD, unresponsive HEENT:  [place]/AT, no JVD, moist mucous membranes, +swollen tongue, Lt ear bleeding/raw Chest: Coarse throughout, no labored breathing CVS: RRR, S1 S2, radial pulses weak/thready Abdomen: Soft, NT, ND Ext: BLE cool to touch, ++edema, slightly mottled Neuro: Unresponsive, unable to [employer], moaning  Labs: CBC    Component Value Date/Time   WBC 35.3* 07/13/2013 0410   RBC 3.11*  07/13/2013 0410   HGB 9.1* 07/13/2013 0410   HCT 26.4* 07/13/2013 0410   PLT 26* 07/13/2013 0410   MCV 84.9 07/13/2013 0410   MCH 29.3 07/13/2013 0410   MCHC 34.5 07/13/2013 0410   RDW 16.1* 07/13/2013 0410   LYMPHSABS 2.2 07/10/2013 1042   MONOABS 0.6 07/10/2013 1042   EOSABS 0.0 07/10/2013 1042   BASOSABS 0.0 07/10/2013 1042    BMET    Component Value Date/Time   NA 153* 07/13/2013 0410   K 4.3 07/13/2013 0410   CL 107 07/13/2013 0410   CO2 27 07/13/2013 0410   GLUCOSE 100* 07/13/2013 0410   BUN 74* 07/13/2013 0410   CREATININE 4.28* 07/13/2013 0410   CALCIUM 5.9* 07/13/2013 0410   GFRNONAA 11* 07/13/2013 0410   GFRAA 13* 07/13/2013 0410    CMP     Component Value Date/Time   NA 153* 07/13/2013 0410   K 4.3 07/13/2013 0410   CL 107 07/13/2013 0410   CO2 27 07/13/2013 0410   GLUCOSE 100* 07/13/2013 0410   BUN 74* 07/13/2013 0410   CREATININE 4.28* 07/13/2013 0410   CALCIUM 5.9* 07/13/2013 0410   PROT 4.8* 07/12/2013 0930   ALBUMIN 1.9* 07/12/2013 0930   AST 702* 07/12/2013 0930   ALT 698* 07/12/2013 0930   ALKPHOS 78 07/12/2013 0930   BILITOT 1.6* 07/12/2013 0930   GFRNONAA 11* 07/13/2013 0410  GFRAA 13* 07/13/2013 0410     Assessment and Plan: 1. Code Status: DNR 2. Symptom Control: 1. Pain: She is moaning and opening eyes to assessment. Educated nursing and asked them to give her a bolus of Dilaudid. Dilaudid continuous infusion at 0.5 mg/hr. Nursing said that bolus helped patient - no longer moaning. Will increase infusion if more frequent boluses are being required. Nursing to call if boluses needed more than twice in an hour. Control dyspnea with infusion and boluses as well. 2. Agitation: Valium prn for anxiety/seizures. 3. Terminal Secretions: Controlled with atropine drops. 3. Psycho/Social: Emotional support provided to family and patient at bedside. Family (via telephone) is concerned about being able to visit with patient when she passes before  the funeral home is called. I assured them that is not the hospitals process and that family is notified prior to the funeral home. 4. Spiritual: Family states chaplain has been stopping by. 5. Disposition: Anticipate hospital death. Consider GIP if needed.     Time In Time Out Total Time Spent with Patient Total Overall Time  1030 1100     Greater than 50%  of this time was spent counseling and coordinating care related to the above assessment and plan.  Yong Channel, NP Palliative Medicine Team Team Phone # [phone]   1

## 2013-07-16 NOTE — Progress Notes (Signed)
Chaplain offered ministry of presence, emotional and spiritual support to one of the patient's brothers. He said their mother will be in after 2pm today and asked that I follow up then.  07/16/13 1100  Clinical Encounter Type  Visited With Family  Visit Type Initial  Referral From Physician

## 2013-07-16 NOTE — Progress Notes (Signed)
Chaplain made a follow up visit to see the patient. The patient's other brother was at bedside. Patient's mom not visiting today per the patient's other bother. Chaplain provided ministry of presence and offered support.   07/16/13 1400  Clinical Encounter Type  Visited With Family  Visit Type Follow-up

## 2013-07-16 NOTE — Progress Notes (Signed)
CSW received call from MD; MD asked if residential hospice was possibility for pt. CSW called pt's mother, and mother referred CSW to pt's brother to make decisions. CSW spoke with brother, who was under the impression that pt could stay in the hospital and that she is too weak to transfer to residential hospice. CSW told brother CSW could schedule a consult with liaison for [facility], residential hospice in [place]. Brother declined stating he is not comfortable with this as he was under the impression pt is too weak to move and family was informed pt could go to [facility]. CSW called MD back and recounted this conversation; MD contacting palliative MD to inquire about GIP. CSW called RNCM and left a voicemail explaining pt's family hesitation and decline for [facility] consult. CSW explained that if residential hospice is to be pursued, CSW believes this information would be best delivered by MD as pt's family has medical concerns that cannot be addressed by CSW. CSW requested call back from [facility] concerning disposition status.   Maryclare Labrador, MSW, [facility] Clinical Social Worker [phone]

## 2013-07-17 MED ORDER — WHITE PETROLATUM GEL
Status: AC
  Administered 2013-07-17: 0.2
  Filled 2013-07-17: qty 5

## 2013-07-17 NOTE — Progress Notes (Signed)
[facility]                                                                                Patient Demographics  Roberta Carpenter, is a 52 y.o. female, DOB - [birth year], JYN:[id number]  Admit date - 07/10/2013   Admitting Physician Oretha Milch, MD  Outpatient Primary MD for the patient is Provider Not In System  LOS - 7   Chief Complaint  Patient presents with  . Cardiac Arrest      Interim history 52 year old with advanced MS, bedbound, nonverbal, requiring assistance for all of her daily activities was found on 12/16 "gasping for air"  by the Sister. Patient required CPR was found to have PEA arrest with downtime of 15-20 minutes. Seen by palliative care and made comfort measures on 12/20.  Assessment & Plan    Active Problems:   MS (multiple sclerosis)   Acute MI   Shock   Acidosis, metabolic   Organic brain syndrome   Protein-calorie malnutrition, severe   Palliative care encounter  STEMI with cardiogenic and septic shock -Patient did require Levophed GTT -She was made DO NOT RESUSCITATE  Status post Cardiac arrest -Resolved  Acute respiratory failure in setting of cardiac arrest -Resolved -Currently do not reintubate  Acute renal failure with metabolic acidosis and hypernatremia -Likely secondary to shock -Creatinine 4.28, acidosis resolved -Acidosis resolved  Possible ischemic bowel with shocked liver/acute liver failure -Patient has had bloody bowel movements. -Currently n.p.o. -Continue Protonix -Likely no intervention, discussion with palliative care  Severe protein calorie malnutrition -Currently n.p.o. May consider tube feeds after discussion with palliative care  Thrombocytopenia with coagulation with possible DIC -Secondary to shock  Bacteremia with gram-positive cocci clusters -Currently on vancomycin with pharmacy monitoring -Blood cultures returned Staphylococcus species coagulase-negative  Hypoglycemia with possible adrenal  insufficiency -Hypoglycemia resolved  Acute encephalopathy with anoxic brain injury -No intervention at this time  Advanced multiple sclerosis -No intervention at this time  Code Status: DNR/Comfort measures only  Family Communication: None at this time.   Disposition Plan: Admitted. Patient is now comfort measures only.  Continue pain control and and valium as needed for comfort.  Will see if patient can be transferred to palliative care. Currently waiting for patient to be evaluated by hospice/GIP.  Procedures/Course 12/16 Admitted after cardiac arrest, made limited code  12/16 Head CT >>> nad, changes consistent with MS  12/17 TTE >>> LVH, EF 65%, diastolic dysfunction, small effusion without tamponade  12/17 Heparin gtt started as Troponin>20, but stopped after bloody bowel movement  12/18 Goals of care meeting with daughters ( medical POAs) >>> DNR, extubate when ready, comfort if deteriorates  12/18 Terminally extubate   Consults   PCCM Palliative Care Cardiology  DVT Prophylaxis None   Lab Results  Component Value Date   PLT 26* 07/13/2013    Medications  Scheduled Meds: . chlorhexidine  15 mL Mouth/Throat BID  . diazepam  2.5 mg Intravenous QHS  . hydrocortisone sod succinate (SOLU-CORTEF) inj  50 mg Intravenous Q12H  . sodium chloride  10-40 mL Intracatheter Q12H   Continuous Infusions: . sodium chloride 10 mL/hr (07/14/13 1110)  . HYDROmorphone 0.25 mg/hr (07/17/13 1224)  PRN Meds:.atropine, diazepam, HYDROmorphone, sodium chloride  Antibiotics    Anti-infectives   Start     Dose/Rate Route Frequency Ordered Stop   07/11/13 1200  piperacillin-tazobactam (ZOSYN) IVPB 2.25 g  Status:  Discontinued     2.25 g 100 mL/hr over 30 Minutes Intravenous 4 times per day 07/11/13 0901 07/14/13 1031   07/10/13 2200  piperacillin-tazobactam (ZOSYN) IVPB 3.375 g  Status:  Discontinued     3.375 g 12.5 mL/hr over 240 Minutes Intravenous 3 times per day 07/10/13  1324 07/11/13 0901   07/10/13 1330  vancomycin (VANCOCIN) 1,750 mg in sodium chloride 0.9 % 500 mL IVPB     1,750 mg 250 mL/hr over 120 Minutes Intravenous STAT 07/10/13 1311 07/10/13 1730   07/10/13 1230  piperacillin-tazobactam (ZOSYN) IVPB 3.375 g     3.375 g 12.5 mL/hr over 240 Minutes Intravenous  Once 07/10/13 1229 07/10/13 1643      Time Spent in minutes   20 minutes   Shenandoah Yeats D.O. on 07/17/2013 at 4:12 PM  Between 7am to 7pm - Pager - [phone]  After 7pm go to [email or web address] - password [email or web address]  And look for the night coverage person covering for me after hours  [facility] Office  [phone]    Subjective:   Roberta Carpenter seen and examined today.   Patient nonverbal.  Objective:   Filed Vitals:   07/14/13 0753 07/14/13 1149 07/15/13 0900 07/17/13 0841  BP: 95/66 97/59 96/62  128/80  Pulse: 74 78 76 70  Temp:   98.1 F (36.7 C) 97.3 F (36.3 C)  TempSrc: Oral Oral Oral   Resp: 17 16 18 16   Height:      Weight:      SpO2: 100% 100% 100% 100%    Wt Readings from Last 3 Encounters:  07/13/13 55.883 kg (123 lb 3.2 oz)  06/29/13 99.5 kg (219 lb 5.7 oz)  01/19/11 54.795 kg (120 lb 12.8 oz)     Intake/Output Summary (Last 24 hours) at 07/17/13 1612 Last data filed at 07/17/13 1500  Gross per 24 hour  Intake      0 ml  Output    135 ml  Net   -135 ml    Exam  General: Well developed, NAD  HEENT: NCAT  Neck: Supple, no JVD, no masses  Cardiovascular: S1 S2 auscultated  Respiratory: Coarse breath sounds  Abdomen: Soft, nontender, nondistended  Extremities: warm dry without cyanosis clubbing. + Edema in the lower extremities  Neuro: Unable to assess  Skin: Warm, dry  Psych: Unable to assess   Data Review   Micro Results Recent Results (from the past 240 hour(s))  CULTURE, BLOOD (ROUTINE X 2)     Status: None   Collection Time    07/10/13 11:15 AM      Result Value Range Status   Specimen Description BLOOD ARM  LEFT   Final   Special Requests BOTTLES DRAWN AEROBIC ONLY 0.5CC   Final   Culture  Setup Time     Final   Value: 07/10/2013 17:40     Performed at [facility]   Culture     Final   Value: STAPHYLOCOCCUS SPECIES (COAGULASE NEGATIVE)     Note: SUSCEPTIBILITIES PERFORMED ON PREVIOUS CULTURE WITHIN THE LAST 5 DAYS.     Note: Gram Stain Report Called to,Read Back By and Verified With: GAIL TURNER ON 07/11/2013 AT 10:57P BY Serafina Mitchell     Performed at [facility]   Report  Status 07/14/2013 FINAL   Final  CULTURE, BLOOD (ROUTINE X 2)     Status: None   Collection Time    07/10/13 11:45 AM      Result Value Range Status   Specimen Description BLOOD RIGHT SUBCLAVIAN   Final   Special Requests BOTTLES DRAWN AEROBIC AND ANAEROBIC 5CC   Final   Culture  Setup Time     Final   Value: 07/10/2013 17:40     Performed at [facility]   Culture     Final   Value: STAPHYLOCOCCUS SPECIES (COAGULASE NEGATIVE)     Note: RIFAMPIN AND GENTAMICIN SHOULD NOT BE USED AS SINGLE DRUGS FOR TREATMENT OF STAPH INFECTIONS.     Note: Gram Stain Report Called to,Read Back By and Verified With: Charlsie Merles 07/11/13 1515 BY SMITHERSJ     Performed at [facility]   Report Status 07/14/2013 FINAL   Final   Organism ID, Bacteria STAPHYLOCOCCUS SPECIES (COAGULASE NEGATIVE)   Final  URINE CULTURE     Status: None   Collection Time    07/10/13  3:29 PM      Result Value Range Status   Specimen Description URINE, CATHETERIZED   Final   Special Requests Normal   Final   Culture  Setup Time     Final   Value: 07/10/2013 20:28     Performed at [facility]   Colony Count     Final   Value: NO GROWTH     Performed at [facility]   Culture     Final   Value: NO GROWTH     Performed at [facility]   Report Status 07/11/2013 FINAL   Final  MRSA PCR SCREENING     Status: None   Collection Time    07/10/13  3:52 PM      Result Value Range Status   MRSA by PCR  NEGATIVE  NEGATIVE Final   Comment:            The GeneXpert MRSA Assay (FDA     approved for NASAL specimens     only), is one component of a     comprehensive MRSA colonization     surveillance program. It is not     intended to diagnose MRSA     infection nor to guide or     monitor treatment for     MRSA infections.  CULTURE, RESPIRATORY (NON-EXPECTORATED)     Status: None   Collection Time    07/11/13 11:49 PM      Result Value Range Status   Specimen Description TRACHEAL ASPIRATE   Final   Special Requests NONE   Final   Gram Stain     Final   Value: NO WBC SEEN     NO SQUAMOUS EPITHELIAL CELLS SEEN     RARE YEAST     Performed at [facility]   Culture     Final   Value: ABUNDANT CANDIDA ALBICANS     Performed at [facility]   Report Status 07/17/2013 FINAL   Final    Radiology Reports Ct Head Wo Contrast  07/10/2013   CLINICAL DATA:  Altered mental status. Hypotension, fever, ST-elevation, and respiratory failure following witnessed cardiac arrest.  EXAM: CT HEAD WITHOUT CONTRAST  TECHNIQUE: Contiguous axial images were obtained from the base of the skull through the vertex without intravenous contrast.  COMPARISON:  Brain MRI from [facility]  05/16/2010  FINDINGS: There is advanced cerebral atrophy for age with particular prominence of the ventricles. Multiple periventricular hypodensities are seen, corresponding to the T2 hyperintense lesions described on prior MRI. There is no evidence of acute large territory infarct, mass, midline shift, intracranial hemorrhage, or extra-axial fluid collection. Visualized paranasal sinuses and mastoid air cells are clear. Orbits are unremarkable. No acute fracture identified.  IMPRESSION: 1. No acute intracranial abnormality identified. 2. Periventricular white matter hypodensities and cerebral atrophy, consistent with history of multiple sclerosis.   Electronically Signed   By: Sebastian Ache   On: 07/10/2013 14:57    Ct Abdomen Pelvis W Contrast  06/28/2013   CLINICAL DATA:  Code sepsis, fever  EXAM: CT ABDOMEN AND PELVIS WITH CONTRAST  TECHNIQUE: Multidetector CT imaging of the abdomen and pelvis was performed using the standard protocol following bolus administration of intravenous contrast.  CONTRAST:  80mL OMNIPAQUE IOHEXOL 300 MG/ML  SOLN  COMPARISON:  Prior CT abdomen/pelvis 01/04/2011  FINDINGS: Lower Chest: Patchy ground-glass attenuation airspace disease and early consolidative changes in the dependent right lower lobe. Visualized cardiac structures within normal limits for size. No pericardial effusion. Unremarkable distal thoracic esophagus.  Abdomen: Unremarkable CT appearance of the stomach, duodenum, spleen, adrenal glands and pancreas. Normal hepatic contour and morphology. Gallbladder is unremarkable. No intra or extrahepatic biliary ductal dilatation.  Unremarkable appearance of the bilateral kidneys. No focal solid lesion, hydronephrosis or nephrolithiasis. Simple cyst in the lower pole of the left kidney.  No evidence of obstruction or focal bowel wall thickening. Normal appendix in the right lower quadrant. The terminal ileum is unremarkable. Large rectal stool ball. Colonic diverticular disease without CT evidence of active inflammation. No free fluid.  Pelvis: Unremarkable appearance of the bladder. Air and fluid noted within the upper vagina. Minimally complex low-attenuation right ovarian cyst versus 2 smaller adjacent cysts. It measured is a single cyst, the maximal diameter is 3.6 cm. If measured individually, the larger cyst measures 2.8 cm. No definite free fluid or suspicious adenopathy.  Bones/Soft Tissues: No acute fracture or aggressive appearing lytic or blastic osseous lesion.  Vascular: No significant atherosclerotic vascular disease, aneurysmal dilatation or acute abnormality.  IMPRESSION: 1. Patchy ground-glass attenuation opacity and early consolidative changes in the dependent right  lower lobe concerning for small volume aspiration versus early bronchopneumonia. 2. Large rectal stool ball concerning for constipation/impaction. 3. Air and fluid noted within the upper vagina. This is of uncertain etiology and clinical significance. 4. Minimally complex versus adjacent simple right ovarian cysts. If the patient is not menopausal, this is almost certainly benign and requires no follow-up.   Electronically Signed   By: Malachy Moan M.D.   On: 06/28/2013 21:07   Dg Chest Port 1 View  07/11/2013   CLINICAL DATA:  Feeding tube placement  EXAM: PORTABLE CHEST - 1 VIEW  COMPARISON:  Prior chest x-ray 06/30/2013  FINDINGS: The tip of the endotracheal tube is 1 cm above the carina. The right IJ central venous catheter is in unchanged position with the tip in the distal SVC. A new enteric feeding tube is present. The tip is incompletely imaged and is likely within the stomach. The nasogastric tube has been removed. Mild bibasilar atelectasis. Otherwise, the lungs are clear. Cardiac and mediastinal contours are within normal limits. No pleural effusion or pneumothorax.  IMPRESSION: 1. The tip of the endotracheal tube is 1 cm above the carina. Consider withdrawing 2 cm for more optimal placement. 2. Interval placement of an enteric feeding  tube. The tip is incompletely visualized at the bottom of the film and is likely still within the stomach consider abdominal radiograph to confirm location.   Electronically Signed   By: Malachy Moan M.D.   On: 07/11/2013 15:33   Dg Chest Portable 1 View  07/10/2013   CLINICAL DATA:  ET tube placement  EXAM: PORTABLE CHEST - 1 VIEW  COMPARISON:  07/10/2013  FINDINGS: Cardiomediastinal silhouette is stable. Endotracheal tube in place. NG tube in mid esophagus. The NG tube should be advanced into the stomach. No diagnostic pneumothorax. Right IJ central with tip in SVC.  IMPRESSION: Endotracheal tube in place. NG tube with tip in mid esophagus. The NG tube  should be advanced into the stomach. No diagnostic pneumothorax.  These results were called by telephone at the time of interpretation on 07/10/2013 at 12:12 PM to Dr. Jaci Carrel , who verbally acknowledged these results.   Electronically Signed   By: Natasha Mead M.D.   On: 07/10/2013 12:12   Dg Chest Portable 1 View  07/10/2013   CLINICAL DATA:  ET tube placement. Multiple sclerosis. Respiratory failure.  EXAM: PORTABLE CHEST - 1 VIEW  COMPARISON:  06/28/2013.  FINDINGS: Normal heart size.  Clear lung fields.  No bony abnormality.  Endotracheal tube has been placed and lies 2.2 cm above carina. Could be pulled back slightly, 2-3 cm for optimal placement. No pneumothorax or effusion.  IMPRESSION: ET tube 2.2 cm above carina. Pullback slightly 2-3 cm for optimal placement. No active cardiopulmonary disease.   Electronically Signed   By: Davonna Belling M.D.   On: 07/10/2013 10:57   Dg Chest Port 1 View  06/28/2013   CLINICAL DATA:  Code sepsis  EXAM: PORTABLE CHEST - 1 VIEW  COMPARISON:  None.  FINDINGS: The heart size and mediastinal contours are within normal limits. Lungs are hyperinflated and there are coarsened interstitial markings noted bilaterally. No airspace consolidation. The visualized skeletal structures are unremarkable.  IMPRESSION: 1. No acute cardiopulmonary abnormalities.   Electronically Signed   By: Signa Kell M.D.   On: 06/28/2013 14:57    CBC  Recent Labs Lab 07/11/13 0415 07/11/13 1223 07/12/13 0930 07/13/13 0410  WBC 29.2*  --  31.8* 35.3*  HGB 11.6*  --  9.1* 9.1*  HCT 35.3*  --  26.1* 26.4*  PLT 81* 61* 23* 26*  MCV 89.1  --  84.5 84.9  MCH 29.3  --  29.4 29.3  MCHC 32.9  --  34.9 34.5  RDW 16.7*  --  16.3* 16.1*    Chemistries   Recent Labs Lab 07/10/13 1710 07/11/13 0415 07/12/13 0930 07/13/13 0410  NA 156* 159* 150* 153*  K 2.3* 2.9* 3.7 4.3  CL 128* 122* 106 107  CO2 10* 15* 27 27  GLUCOSE 329* 180* 156* 100*  BUN 40* 56* 65* 74*   CREATININE 1.50* 2.52* 3.50* 4.28*  CALCIUM 4.7* 6.4* 5.8* 5.9*  AST  --   --  702*  --   ALT  --   --  698*  --   ALKPHOS  --   --  78  --   BILITOT  --   --  1.6*  --    ------------------------------------------------------------------------------------------------------------------ estimated creatinine clearance is 13.9 ml/min (by C-G formula based on Cr of 4.28). ------------------------------------------------------------------------------------------------------------------ No results found for this basename: HGBA1C,  in the last 72 hours ------------------------------------------------------------------------------------------------------------------ No results found for this basename: CHOL, HDL, LDLCALC, TRIG, CHOLHDL, LDLDIRECT,  in the last  72 hours ------------------------------------------------------------------------------------------------------------------ No results found for this basename: TSH, T4TOTAL, FREET3, T3FREE, THYROIDAB,  in the last 72 hours ------------------------------------------------------------------------------------------------------------------ No results found for this basename: VITAMINB12, FOLATE, FERRITIN, TIBC, IRON, RETICCTPCT,  in the last 72 hours  Coagulation profile  Recent Labs Lab 07/11/13 1223 07/12/13 0930 07/13/13 0410  INR 3.04* 1.94* 1.40    No results found for this basename: DDIMER,  in the last 72 hours  Cardiac Enzymes  Recent Labs Lab 07/11/13 1230  TROPONINI >20.00*   ------------------------------------------------------------------------------------------------------------------ No components found with this basename: POCBNP,

## 2013-07-17 NOTE — Progress Notes (Signed)
LATE ENTRY  Clinical Social Work Department BRIEF PSYCHOSOCIAL ASSESSMENT 07/17/2013  Patient:  Roberta Carpenter, Roberta Carpenter     Account Number:  [id number]     Admit date:  07/10/2013  Clinical Social Worker:  Varney Biles  Date/Time:  07/16/2013 11:50 AM  Referred by:  Physician  Date Referred:  07/16/2013 Referred for  Residential hospice placement   Other Referral:   Interview type:  Family Other interview type:    PSYCHOSOCIAL DATA Living Status:  FAMILY Admitted from facility:   Level of care:   Primary support name:  Roberta Carpenter ([phone]) Primary support relationship to patient:  PARENT Degree of support available:   Good--pt's mother, brother, and sister are involved in pt care.    CURRENT CONCERNS Current Concerns  Other - See comment   Other Concerns:   Residential hospice placement    SOCIAL WORK ASSESSMENT / PLAN CSW received call from MD; MD asked if residential hospice was possibility for pt. CSW called pt's mother, and mother referred CSW to pt's brother to make decisions. CSW spoke with brother, who was under the impression that pt could stay in the hospital and that she is too weak to transfer to residential hospice. CSW told brother CSW could schedule a consult with liaison for [facility], residential hospice in [place]. Brother declined stating he is not comfortable with this as he was under the impression pt is too weak to move and family was informed pt could go to [facility]. CSW called MD back and recounted this conversation; MD contacting palliative MD to inquire about GIP. CSW called RNCM and left a voicemail explaining pt's family hesitation and decline for [facility] consult. CSW explained that if residential hospice is to be pursued, CSW believes this information would be best delivered by MD as pt's family has medical concerns that cannot be addressed by CSW. RNCM and CSW collaborated this morning, and RNCM is going to again consult GIP.    Assessment/plan status:  No Further Intervention Required Other assessment/ plan:   Information/referral to community resources:   GIP.    PATIENT'S/FAMILY'S RESPONSE TO PLAN OF CARE: Pt's mother teary throughout conversation, and family understandably confused as they were under the impression pt is too weak to be transferred from the hospital. CSW expressed understanding of this and asked if family wanted residential hospice consult to see if this would be an option. Pt kindly declined. RNCM pursuing GIP placement. CSW signing off.       Maryclare Labrador, MSW, [facility] Clinical Social Worker [phone]

## 2013-07-17 NOTE — Progress Notes (Signed)
Late Entry: Request received today from [facility], to evaluate patient for [facility] level of care; patient information is being reviewed; Clinical research associate will follow-up in am.  Thank you.  Valente David, RN 07/17/2013, 8:38 PM [facility] RN Liaison [phone]

## 2013-07-18 ENCOUNTER — Inpatient Hospital Stay (HOSPITAL_COMMUNITY)
Admission: AD | Admit: 2013-07-18 | Discharge: 2013-08-26 | DRG: 871 | Disposition: E | Source: Ambulatory Visit | Attending: Internal Medicine | Admitting: Internal Medicine

## 2013-07-18 DIAGNOSIS — Z7401 Bed confinement status: Secondary | ICD-10-CM

## 2013-07-18 DIAGNOSIS — Z681 Body mass index (BMI) 19 or less, adult: Secondary | ICD-10-CM

## 2013-07-18 DIAGNOSIS — A419 Sepsis, unspecified organism: Principal | ICD-10-CM | POA: Diagnosis present

## 2013-07-18 DIAGNOSIS — J9600 Acute respiratory failure, unspecified whether with hypoxia or hypercapnia: Secondary | ICD-10-CM | POA: Diagnosis present

## 2013-07-18 DIAGNOSIS — G9340 Encephalopathy, unspecified: Secondary | ICD-10-CM | POA: Diagnosis present

## 2013-07-18 DIAGNOSIS — E43 Unspecified severe protein-calorie malnutrition: Secondary | ICD-10-CM

## 2013-07-18 DIAGNOSIS — F079 Unspecified personality and behavioral disorder due to known physiological condition: Secondary | ICD-10-CM | POA: Diagnosis present

## 2013-07-18 DIAGNOSIS — H11429 Conjunctival edema, unspecified eye: Secondary | ICD-10-CM | POA: Diagnosis present

## 2013-07-18 DIAGNOSIS — J189 Pneumonia, unspecified organism: Secondary | ICD-10-CM

## 2013-07-18 DIAGNOSIS — K117 Disturbances of salivary secretion: Secondary | ICD-10-CM

## 2013-07-18 DIAGNOSIS — Z515 Encounter for palliative care: Secondary | ICD-10-CM

## 2013-07-18 DIAGNOSIS — Z66 Do not resuscitate: Secondary | ICD-10-CM | POA: Diagnosis present

## 2013-07-18 DIAGNOSIS — G35 Multiple sclerosis: Secondary | ICD-10-CM | POA: Diagnosis present

## 2013-07-18 DIAGNOSIS — N319 Neuromuscular dysfunction of bladder, unspecified: Secondary | ICD-10-CM | POA: Diagnosis present

## 2013-07-18 DIAGNOSIS — R0600 Dyspnea, unspecified: Secondary | ICD-10-CM

## 2013-07-18 DIAGNOSIS — F411 Generalized anxiety disorder: Secondary | ICD-10-CM | POA: Diagnosis present

## 2013-07-18 DIAGNOSIS — E41 Nutritional marasmus: Secondary | ICD-10-CM | POA: Diagnosis present

## 2013-07-18 HISTORY — DX: Encounter for palliative care: Z51.5

## 2013-07-18 MED ORDER — ACETAMINOPHEN 325 MG PO TABS: 650.0000 mg | ORAL_TABLET | Freq: Four times a day (QID) | ORAL | Status: DC | PRN

## 2013-07-18 MED ORDER — SODIUM CHLORIDE 0.9 % IV SOLN
1.5000 mg/h | INTRAVENOUS | Status: DC
  Administered 2013-07-18 – 2013-07-26 (×7): 0.5 mg/h via INTRAVENOUS
  Filled 2013-07-18 (×4): qty 5

## 2013-07-18 MED ORDER — ATROPINE SULFATE 1 % OP SOLN
4.0000 [drp] | OPHTHALMIC | Status: DC | PRN
  Filled 2013-07-18: qty 2

## 2013-07-18 MED ORDER — SODIUM CHLORIDE 0.9 % IJ SOLN
10.0000 mL | INTRAMUSCULAR | Status: DC | PRN
  Administered 2013-07-18: 20 mL
  Administered 2013-07-19 – 2013-07-20 (×8): 10 mL
  Administered 2013-07-21 – 2013-07-24 (×5): 20 mL
  Administered 2013-07-25 (×2): 10 mL
  Administered 2013-07-26: 20 mL

## 2013-07-18 MED ORDER — ACETAMINOPHEN 650 MG RE SUPP: 650.0000 mg | Freq: Four times a day (QID) | RECTAL | Status: DC | PRN

## 2013-07-18 MED ORDER — HYDROMORPHONE BOLUS VIA INFUSION
0.5000 mg | INTRAVENOUS | Status: DC | PRN
  Administered 2013-07-19 – 2013-07-26 (×5): 0.5 mg via INTRAVENOUS
  Filled 2013-07-18: qty 1

## 2013-07-18 MED ORDER — SCOPOLAMINE 1 MG/3DAYS TD PT72
1.0000 | MEDICATED_PATCH | TRANSDERMAL | Status: DC
  Administered 2013-07-18 – 2013-07-24 (×3): 1.5 mg via TRANSDERMAL
  Filled 2013-07-18 (×3): qty 1

## 2013-07-18 MED ORDER — SODIUM CHLORIDE 0.9 % IV SOLN: 0.5000 mg/h | INTRAVENOUS | Status: DC

## 2013-07-18 MED ORDER — SODIUM CHLORIDE 0.9 % IJ SOLN
10.0000 mL | INTRAMUSCULAR | Status: DC | PRN
  Administered 2013-07-18: 20 mL

## 2013-07-18 NOTE — Progress Notes (Signed)
Ms. Oh is still on a Dilaudid infusion and is resting comfortably today. She is not moaning and her eyes are closed. She intermittently opens her eyes but does not track or follow commands. Her brother in law is at the bedside and does not have any questions/concerns at this time. He says they are taking shifts being with her. I made sure the family has our card and number should any concerns arise. Emotional support was given to patient and family during this difficult time.  Yong Channel, NP Palliative Medicine Team Team Phone # [phone]

## 2013-07-18 NOTE — H&P (Signed)
Palliative Medicine Team at [facility] GIP History and Physical  Roberta Carpenter WUJ:[id number] DOB: 28-May-1961 DOA: 07/16/2013 PCP: Provider Not In System     Impression/Recommendations Active Problems:   Multiple sclerosis s/p Cardiac Arrest Respiratory failure  Staph Coagulase Neg bacteremia 12/16   1. Respiratory Failure s/p extubation to comfort after Cardiac arrest:  Currently on dilaudid drip to continue for comfort 2. Multiple sclerosis: treat pain from spasms with dilaudid drip 3. Decubitus: continue local care.  If patient continue to survive consider air mattress over lay .  She is so near death that I do not think it will benefit her.  She is comfortable.   Chief Complaint: Unresponsive and with Respiratory distress s/p extubation to comfort after Cardiac arrest in the face of Multiple Sclerosis  HPI:  Patient is a 52 yr old african [place] female with known advanced multiple sclerosis.  At baseline she was bed bound.  She was found at home in respiratory failure. She is s/p cardiac arrest (PEA) per review of the chart,  Review of Systems: Unable due to patient minimally responsive.  Past Medical History  Diagnosis Date  . MS (multiple sclerosis)     Roberta Carpenter 05/21/2000 (06/29/2013)  . Neurogenic bladder     Roberta Carpenter 05/21/2000 (06/29/2013)  . 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 CAP (community acquired pneumonia)     Roberta Carpenter 06/28/2013 (06/29/2013)   Past Surgical History  Procedure Laterality Date  . Colon surgery    . Appendectomy    . Knee arthroscopy Right     Roberta Carpenter 05/21/2000 (06/29/2013)  . Abdominal exploration surgery  04/2009    w/LOA/notes 05/19/2009 06/29/2013)  . Abdominal hysterectomy  04/2009    supracervical/notes 05/19/2009 (06/29/2013)  . Exploratory laparotomy  12/2010    w/LOA/notes 01/04/2011 (06/29/2013)  . Exploratory laparotomy  12/2010    w/LOA/notes 01/04/2011 (06/29/2013)   Social History:   reports that she has never smoked. She has never used smokeless tobacco. She reports that she does not drink alcohol or use illicit drugs.  No Known Allergies Family History  Problem Relation Age of Onset  . Other      no h/o premature CAD.    Prior to Admission medications   Not on File   Physical Exam: There were no vitals taken for this visit. There were no vitals filed for this visit.   General:  Not responsive to tactile or verbal stimuli but eyes are open  Eyes: open, mild chemosis right eye with some icterus  ENT: pupils round and slowly  reactive, mmm, poor dentition  Cardiovascular:  Regular, distant , s1, S2  Respiratory: mild increased work of breathing more agonal than extertional  Abdomen: soft, no grimace on pallpation  Skin: nursing reports decubiti  Musculoskeletal:  Contracted due to MS  Neurologic:  Altered mental status , able to open eyes but close back   Labs on Admission:  Basic Metabolic Panel:  Recent Labs Lab 07/12/13 0930 07/13/13 0410  NA 150* 153*  K 3.7 4.3  CL 106 107  CO2 27 27  GLUCOSE 156* 100*  BUN 65* 74*  CREATININE 3.50* 4.28*  CALCIUM 5.8* 5.9*   Liver Function Tests:  Recent Labs Lab 07/12/13 0930  AST 702*  ALT 698*  ALKPHOS 78  BILITOT 1.6*  PROT 4.8*  ALBUMIN 1.9*     CBC:  Recent Labs Lab 07/12/13 0930 07/13/13 0410  WBC 31.8* 35.3*  HGB 9.1*  9.1*  HCT 26.1* 26.4*  MCV 84.5 84.9  PLT 23* 26*   CBG:  Recent Labs Lab 07/13/13 2116 07/13/13 2352 07/14/13 0411 07/14/13 0752 07/14/13 1147  GLUCAP 102* 101* 90 102* 105*    Radiological Exams on Admission:12/17 1. The tip of the endotracheal tube is 1 cm above the carina.  Consider withdrawing 2 cm for more optimal placement.  2. Interval placement of an enteric feeding tube. The tip is  incompletely visualized at the bottom of the film and is likely  still within the stomach consider abdominal radiograph to confirm   location.    Roberta Going L. Ladona Ridgel, MD MBA The Palliative Medicine Team at [facility] Phone: [phone] Pager: [phone]

## 2013-07-18 NOTE — Progress Notes (Signed)
[facility] PROGRESS NOTE Interim History: 52 yo AAF with an extensive PMH that includes MS/OBS that leaves her minimally responsive and requiring around the clock care givers. She was found 12-16 "gasping for air" by family. They started CPR and EMS was activated. Multiple rounds of EPI and CPR were required. Intubated and Rt I J cvl per EDP. Rt fem Aline placed. Cardiology has seen her for troponin of 9.34 and ST changes in inferior lateral leads but has declined invasive intervention. She will not be cooled due to fever of 104, profound shock liver. Seen by palliative care and made comfort measures on 12/20.    Assessment/Plan: Possible ischemic bowel with shocked liver/acute liver failure  -Patient has had bloody bowel movements.  -Currently n.p.o.  -Continue Protonix  -discussion with palliative care, made her comfort care. - On dilaudid drip.  STEMI with cardiogenic and septic shock  -Patient did require Levophed GTT  -She was made DO NOT RESUSCITATE.   Status post Cardiac arrest  -Resolved.  Acute respiratory failure in setting of cardiac arrest  -Resolved  -Currently do not reintubate   Acute renal failure with metabolic acidosis and hypernatremia  -Likely secondary to shock  -Creatinine 4.28, acidosis resolved  -Acidosis resolved   Severe protein calorie malnutrition  -Currently n.p.o. May consider tube feeds after discussion with palliative care.  Thrombocytopenia with coagulation with possible DIC  -Secondary to shock   Bacteremia with gram-positive cocci clusters  -Currently on vancomycin with pharmacy monitoring  -Blood cultures returned Staphylococcus species coagulase-negative   Hypoglycemia with possible adrenal insufficiency  -Hypoglycemia resolved   Acute encephalopathy with anoxic brain injury  -No intervention at this time   Advanced multiple sclerosis  -No intervention at this time   Code Status: comfort Family Communication: none   Disposition Plan: Admitted. Patient is now comfort measures only. Continue pain control and and valium as needed for comfort   Consultants: PCCM  Palliative Care  Cardiology   Procedures: 12/16 Admitted after cardiac arrest, made limited code  12/16 Head CT >>> nad, changes consistent with MS  12/17 TTE >>> LVH, EF 65%, diastolic dysfunction, small effusion without tamponade  12/17 Heparin gtt started as Troponin>20, but stopped after bloody bowel movement  12/18 Goals of care meeting with daughters ( medical POAs) >>> DNR, extubate when ready, comfort if deteriorates  12/18 Terminally extubate    Antibiotics:  None   HPI/Subjective: Non verbal none responsive.  Objective: Filed Vitals:   07/14/13 1149 07/15/13 0900 07/17/13 0841 07/07/2013 0542  BP: 97/59 96/62 128/80 154/94  Pulse: 78 76 70 80  Temp:  98.1 F (36.7 C) 97.3 F (36.3 C) 98.2 F (36.8 C)  TempSrc: Oral Oral  Oral  Resp: 16 18 16 18   Height:      Weight:      SpO2: 100% 100% 100% 95%    Intake/Output Summary (Last 24 hours) at 06/29/2013 0713 Last data filed at 07/14/2013 0600  Gross per 24 hour  Intake 988.17 ml  Output      0 ml  Net 988.17 ml   Filed Weights   07/12/13 0500 07/13/13 0500 07/13/13 2119  Weight: 53.5 kg (117 lb 15.1 oz) 54.6 kg (120 lb 5.9 oz) 55.883 kg (123 lb 3.2 oz)    Exam:  General: in no acute distress.  HEENT: No bruits, no goiter.  Heart: Regular rate and rhythm, without murmurs, rubs, gallops.  Lungs: Good air movement, bilateral air movement.    Data Reviewed: Basic  Metabolic Panel:  Recent Labs Lab 07/12/13 0930 07/13/13 0410  NA 150* 153*  K 3.7 4.3  CL 106 107  CO2 27 27  GLUCOSE 156* 100*  BUN 65* 74*  CREATININE 3.50* 4.28*  CALCIUM 5.8* 5.9*   Liver Function Tests:  Recent Labs Lab 07/12/13 0930  AST 702*  ALT 698*  ALKPHOS 78  BILITOT 1.6*  PROT 4.8*  ALBUMIN 1.9*   No results found for this basename: LIPASE, AMYLASE,  in the last  168 hours No results found for this basename: AMMONIA,  in the last 168 hours CBC:  Recent Labs Lab 07/11/13 1223 07/12/13 0930 07/13/13 0410  WBC  --  31.8* 35.3*  HGB  --  9.1* 9.1*  HCT  --  26.1* 26.4*  MCV  --  84.5 84.9  PLT 61* 23* 26*   Cardiac Enzymes:  Recent Labs Lab 07/11/13 1230  TROPONINI >20.00*   BNP (last 3 results) No results found for this basename: PROBNP,  in the last 8760 hours CBG:  Recent Labs Lab 07/13/13 2116 07/13/13 2352 07/14/13 0411 07/14/13 0752 07/14/13 1147  GLUCAP 102* 101* 90 102* 105*    Recent Results (from the past 240 hour(s))  CULTURE, BLOOD (ROUTINE X 2)     Status: None   Collection Time    07/10/13 11:15 AM      Result Value Range Status   Specimen Description BLOOD ARM LEFT   Final   Special Requests BOTTLES DRAWN AEROBIC ONLY 0.5CC   Final   Culture  Setup Time     Final   Value: 07/10/2013 17:40     Performed at [facility]   Culture     Final   Value: STAPHYLOCOCCUS SPECIES (COAGULASE NEGATIVE)     Note: SUSCEPTIBILITIES PERFORMED ON PREVIOUS CULTURE WITHIN THE LAST 5 DAYS.     Note: Gram Stain Report Called to,Read Back By and Verified With: GAIL TURNER ON 07/11/2013 AT 10:57P BY WILEJ     Performed at [facility]   Report Status 07/14/2013 FINAL   Final  CULTURE, BLOOD (ROUTINE X 2)     Status: None   Collection Time    07/10/13 11:45 AM      Result Value Range Status   Specimen Description BLOOD RIGHT SUBCLAVIAN   Final   Special Requests BOTTLES DRAWN AEROBIC AND ANAEROBIC 5CC   Final   Culture  Setup Time     Final   Value: 07/10/2013 17:40     Performed at [facility]   Culture     Final   Value: STAPHYLOCOCCUS SPECIES (COAGULASE NEGATIVE)     Note: RIFAMPIN AND GENTAMICIN SHOULD NOT BE USED AS SINGLE DRUGS FOR TREATMENT OF STAPH INFECTIONS.     Note: Gram Stain Report Called to,Read Back By and Verified With: Charlsie Merles 07/11/13 1515 BY [email or web address]     Performed at  [facility]   Report Status 07/14/2013 FINAL   Final   Organism ID, Bacteria STAPHYLOCOCCUS SPECIES (COAGULASE NEGATIVE)   Final  URINE CULTURE     Status: None   Collection Time    07/10/13  3:29 PM      Result Value Range Status   Specimen Description URINE, CATHETERIZED   Final   Special Requests Normal   Final   Culture  Setup Time     Final   Value: 07/10/2013 20:28     Performed at [facility] Count  Final   Value: NO GROWTH     Performed at [facility]   Culture     Final   Value: NO GROWTH     Performed at [facility]   Report Status 07/11/2013 FINAL   Final  MRSA PCR SCREENING     Status: None   Collection Time    07/10/13  3:52 PM      Result Value Range Status   MRSA by PCR NEGATIVE  NEGATIVE Final   Comment:            The GeneXpert MRSA Assay (FDA     approved for NASAL specimens     only), is one component of a     comprehensive MRSA colonization     surveillance program. It is not     intended to diagnose MRSA     infection nor to guide or     monitor treatment for     MRSA infections.  CULTURE, RESPIRATORY (NON-EXPECTORATED)     Status: None   Collection Time    07/11/13 11:49 PM      Result Value Range Status   Specimen Description TRACHEAL ASPIRATE   Final   Special Requests NONE   Final   Gram Stain     Final   Value: NO WBC SEEN     NO SQUAMOUS EPITHELIAL CELLS SEEN     RARE YEAST     Performed at [facility]   Culture     Final   Value: ABUNDANT CANDIDA ALBICANS     Performed at [facility]   Report Status 07/17/2013 FINAL   Final     Studies: No results found.  Scheduled Meds: . chlorhexidine  15 mL Mouth/Throat BID  . diazepam  2.5 mg Intravenous QHS  . hydrocortisone sod succinate (SOLU-CORTEF) inj  50 mg Intravenous Q12H  . sodium chloride  10-40 mL Intracatheter Q12H   Continuous Infusions: . sodium chloride 20 mL/hr (07/17/13 2234)  . HYDROmorphone 0.25 mg/hr  (07/17/13 2216)     Marinda Elk  [facility] Pager [phone]. If 8PM-8AM, please contact night-coverage at [email or web address], password [facility] 07/21/2013, 7:13 AM  LOS: 8 days

## 2013-07-18 NOTE — Discharge Summary (Signed)
Physician Discharge Summary  Roberta Carpenter ZOX:[id number] DOB: March 16, 1961 DOA: 07/10/2013  PCP: Provider Not In System  Admit date: 07/10/2013 Discharge date: 07/03/2013  Time spent: 35 minutes  Recommendations for Outpatient Follow-up:  Changed to GIP status. PMT team to take over.  Discharge Diagnoses:  Active Problems:   MS (multiple sclerosis)   Acute MI   Shock   Acidosis, metabolic   Organic brain syndrome   Protein-calorie malnutrition, severe   Palliative care encounter   Discharge Condition: guarded  Diet recommendation: none  Filed Weights   07/12/13 0500 07/13/13 0500 07/13/13 2119  Weight: 53.5 kg (117 lb 15.1 oz) 54.6 kg (120 lb 5.9 oz) 55.883 kg (123 lb 3.2 oz)    History of present illness:  52 yo AAF with advanced MS, bed bound, non verbal, requiring assistance for all ADL was found 12-16 "gasping for air" by sister, who provided bystander CPR, PEA arrest with down time x 15-20 mins - febrile in ED on pressors  EKG- diffuse ST elevation with elevated troponin     Hospital Course:  Possible ischemic bowel with shocked liver/acute liver failure  - Patient has had bloody bowel movements.  - Currently n.p.o.  - discussion with palliative care, made her comfort care.  - On dilaudid drip.  - Change to GIP status. appreciate PMT assistance.  STEMI with cardiogenic and septic shock  - Patient did require Levophed GTT, after discussion with PMT, family decided to move toward comfort care. - She was made DO NOT RESUSCITATE.   VDRF in the setting Status post Cardiac arrest  - terminally extubated.  Acute renal failure with metabolic acidosis and hypernatremia  -Likely secondary to shock  -Creatinine 4.28, acidosis resolved  -Acidosis resolved   Severe protein calorie malnutrition  -Currently n.p.o. May consider tube feeds after discussion with palliative care.   Thrombocytopenia with coagulation with possible DIC  -Secondary to shock   Bacteremia  with gram-positive cocci clusters  - d/c vanc. -Blood cultures returned Staphylococcus species coagulase-negative 1/2 most likely contaminate  Acute encephalopathy with anoxic brain injury  -No intervention at this time   Advanced multiple sclerosis  -No intervention at this time   Procedures: SIGNIFICANT EVENTS / STUDIES:  12/16 Admitted after cardiac arrest, made limited code  12/16 Head CT >>> nad, changes consistent with MS  12/17 TTE >>> LVH, EF 65%, diastolic dysfunction, small effusion without tamponade  12/17 Heparin gtt started as Troponin>20, but stopped after bloody bowel movement  12/18 Goals of care meeting with daughters ( medical POAs) >>> DNR, extubate when ready, comfort if deteriorates  12/18 Terminally extubate  LINES / TUBES:  OTT 12/16 >>> 12/18  R IJ CVL 12/16 >>>  R fem A-line 12/16 >>> 12/18  Foley 12/16 >>>  CULTURES:  12/16 MRSA PCR >>> neg  12/16 Blood >>> GPC in clusters >>>  12/16 Urine >>> neg  ANTIBIOTICS:  Zosyn 12/16 >>>  Vancomycin 12/16 >>>   Consultations:  PMT  Cardiology  PCCM  Discharge Exam: Filed Vitals:   06/26/2013 0833  BP: 153/94  Pulse: 78  Temp: 97.5 F (36.4 C)  Resp: 17    General: A&O x3 Cardiovascular: RRR Respiratory: good air movement CTA B/L  Discharge Instructions     Medication List    STOP taking these medications       baclofen 10 MG tablet  Commonly known as:  LIORESAL     tiZANidine 4 MG tablet  Commonly known as:  ZANAFLEX  No Known Allergies    The results of significant diagnostics from this hospitalization (including imaging, microbiology, ancillary and laboratory) are listed below for reference.    Significant Diagnostic Studies: Ct Head Wo Contrast  07/10/2013   CLINICAL DATA:  Altered mental status. Hypotension, fever, ST-elevation, and respiratory failure following witnessed cardiac arrest.  EXAM: CT HEAD WITHOUT CONTRAST  TECHNIQUE: Contiguous axial images were  obtained from the base of the skull through the vertex without intravenous contrast.  COMPARISON:  Brain MRI from [facility] 05/16/2010  FINDINGS: There is advanced cerebral atrophy for age with particular prominence of the ventricles. Multiple periventricular hypodensities are seen, corresponding to the T2 hyperintense lesions described on prior MRI. There is no evidence of acute large territory infarct, mass, midline shift, intracranial hemorrhage, or extra-axial fluid collection. Visualized paranasal sinuses and mastoid air cells are clear. Orbits are unremarkable. No acute fracture identified.  IMPRESSION: 1. No acute intracranial abnormality identified. 2. Periventricular white matter hypodensities and cerebral atrophy, consistent with history of multiple sclerosis.   Electronically Signed   By: Sebastian Ache   On: 07/10/2013 14:57   Ct Abdomen Pelvis W Contrast  06/28/2013   CLINICAL DATA:  Code sepsis, fever  EXAM: CT ABDOMEN AND PELVIS WITH CONTRAST  TECHNIQUE: Multidetector CT imaging of the abdomen and pelvis was performed using the standard protocol following bolus administration of intravenous contrast.  CONTRAST:  80mL OMNIPAQUE IOHEXOL 300 MG/ML  SOLN  COMPARISON:  Prior CT abdomen/pelvis 01/04/2011  FINDINGS: Lower Chest: Patchy ground-glass attenuation airspace disease and early consolidative changes in the dependent right lower lobe. Visualized cardiac structures within normal limits for size. No pericardial effusion. Unremarkable distal thoracic esophagus.  Abdomen: Unremarkable CT appearance of the stomach, duodenum, spleen, adrenal glands and pancreas. Normal hepatic contour and morphology. Gallbladder is unremarkable. No intra or extrahepatic biliary ductal dilatation.  Unremarkable appearance of the bilateral kidneys. No focal solid lesion, hydronephrosis or nephrolithiasis. Simple cyst in the lower pole of the left kidney.  No evidence of obstruction or focal bowel wall thickening.  Normal appendix in the right lower quadrant. The terminal ileum is unremarkable. Large rectal stool ball. Colonic diverticular disease without CT evidence of active inflammation. No free fluid.  Pelvis: Unremarkable appearance of the bladder. Air and fluid noted within the upper vagina. Minimally complex low-attenuation right ovarian cyst versus 2 smaller adjacent cysts. It measured is a single cyst, the maximal diameter is 3.6 cm. If measured individually, the larger cyst measures 2.8 cm. No definite free fluid or suspicious adenopathy.  Bones/Soft Tissues: No acute fracture or aggressive appearing lytic or blastic osseous lesion.  Vascular: No significant atherosclerotic vascular disease, aneurysmal dilatation or acute abnormality.  IMPRESSION: 1. Patchy ground-glass attenuation opacity and early consolidative changes in the dependent right lower lobe concerning for small volume aspiration versus early bronchopneumonia. 2. Large rectal stool ball concerning for constipation/impaction. 3. Air and fluid noted within the upper vagina. This is of uncertain etiology and clinical significance. 4. Minimally complex versus adjacent simple right ovarian cysts. If the patient is not menopausal, this is almost certainly benign and requires no follow-up.   Electronically Signed   By: Malachy Moan M.D.   On: 06/28/2013 21:07   Dg Chest Port 1 View  07/11/2013   CLINICAL DATA:  Feeding tube placement  EXAM: PORTABLE CHEST - 1 VIEW  COMPARISON:  Prior chest x-ray 06/30/2013  FINDINGS: The tip of the endotracheal tube is 1 cm above the carina. The right IJ  central venous catheter is in unchanged position with the tip in the distal SVC. A new enteric feeding tube is present. The tip is incompletely imaged and is likely within the stomach. The nasogastric tube has been removed. Mild bibasilar atelectasis. Otherwise, the lungs are clear. Cardiac and mediastinal contours are within normal limits. No pleural effusion or  pneumothorax.  IMPRESSION: 1. The tip of the endotracheal tube is 1 cm above the carina. Consider withdrawing 2 cm for more optimal placement. 2. Interval placement of an enteric feeding tube. The tip is incompletely visualized at the bottom of the film and is likely still within the stomach consider abdominal radiograph to confirm location.   Electronically Signed   By: Malachy Moan M.D.   On: 07/11/2013 15:33   Dg Chest Portable 1 View  07/10/2013   CLINICAL DATA:  ET tube placement  EXAM: PORTABLE CHEST - 1 VIEW  COMPARISON:  07/10/2013  FINDINGS: Cardiomediastinal silhouette is stable. Endotracheal tube in place. NG tube in mid esophagus. The NG tube should be advanced into the stomach. No diagnostic pneumothorax. Right IJ central with tip in SVC.  IMPRESSION: Endotracheal tube in place. NG tube with tip in mid esophagus. The NG tube should be advanced into the stomach. No diagnostic pneumothorax.  These results were called by telephone at the time of interpretation on 07/10/2013 at 12:12 PM to Dr. Jaci Carrel , who verbally acknowledged these results.   Electronically Signed   By: Natasha Mead M.D.   On: 07/10/2013 12:12   Dg Chest Portable 1 View  07/10/2013   CLINICAL DATA:  ET tube placement. Multiple sclerosis. Respiratory failure.  EXAM: PORTABLE CHEST - 1 VIEW  COMPARISON:  06/28/2013.  FINDINGS: Normal heart size.  Clear lung fields.  No bony abnormality.  Endotracheal tube has been placed and lies 2.2 cm above carina. Could be pulled back slightly, 2-3 cm for optimal placement. No pneumothorax or effusion.  IMPRESSION: ET tube 2.2 cm above carina. Pullback slightly 2-3 cm for optimal placement. No active cardiopulmonary disease.   Electronically Signed   By: Davonna Belling M.D.   On: 07/10/2013 10:57   Dg Chest Port 1 View  06/28/2013   CLINICAL DATA:  Code sepsis  EXAM: PORTABLE CHEST - 1 VIEW  COMPARISON:  None.  FINDINGS: The heart size and mediastinal contours are within normal  limits. Lungs are hyperinflated and there are coarsened interstitial markings noted bilaterally. No airspace consolidation. The visualized skeletal structures are unremarkable.  IMPRESSION: 1. No acute cardiopulmonary abnormalities.   Electronically Signed   By: Signa Kell M.D.   On: 06/28/2013 14:57    Microbiology: Recent Results (from the past 240 hour(s))  CULTURE, BLOOD (ROUTINE X 2)     Status: None   Collection Time    07/10/13 11:15 AM      Result Value Range Status   Specimen Description BLOOD ARM LEFT   Final   Special Requests BOTTLES DRAWN AEROBIC ONLY 0.5CC   Final   Culture  Setup Time     Final   Value: 07/10/2013 17:40     Performed at [facility]   Culture     Final   Value: STAPHYLOCOCCUS SPECIES (COAGULASE NEGATIVE)     Note: SUSCEPTIBILITIES PERFORMED ON PREVIOUS CULTURE WITHIN THE LAST 5 DAYS.     Note: Gram Stain Report Called to,Read Back By and Verified With: GAIL TURNER ON 07/11/2013 AT 10:57P BY WILEJ     Performed at [facility]   Report Status 07/14/2013 FINAL   Final  CULTURE, BLOOD (ROUTINE X 2)     Status: None   Collection Time    07/10/13 11:45 AM      Result Value Range Status   Specimen Description BLOOD RIGHT SUBCLAVIAN   Final   Special Requests BOTTLES DRAWN AEROBIC AND ANAEROBIC 5CC   Final   Culture  Setup Time     Final   Value: 07/10/2013 17:40     Performed at [facility]   Culture     Final   Value: STAPHYLOCOCCUS SPECIES (COAGULASE NEGATIVE)     Note: RIFAMPIN AND GENTAMICIN SHOULD NOT BE USED AS SINGLE DRUGS FOR TREATMENT OF STAPH INFECTIONS.     Note: Gram Stain Report Called to,Read Back By and Verified With: Charlsie Merles 07/11/13 1515 BY SMITHERSJ     Performed at [facility]   Report Status 07/14/2013 FINAL   Final   Organism ID, Bacteria STAPHYLOCOCCUS SPECIES (COAGULASE NEGATIVE)   Final  URINE CULTURE     Status: None   Collection Time    07/10/13  3:29 PM      Result Value Range  Status   Specimen Description URINE, CATHETERIZED   Final   Special Requests Normal   Final   Culture  Setup Time     Final   Value: 07/10/2013 20:28     Performed at [facility]   Colony Count     Final   Value: NO GROWTH     Performed at [facility]   Culture     Final   Value: NO GROWTH     Performed at [facility]   Report Status 07/11/2013 FINAL   Final  MRSA PCR SCREENING     Status: None   Collection Time    07/10/13  3:52 PM      Result Value Range Status   MRSA by PCR NEGATIVE  NEGATIVE Final   Comment:            The GeneXpert MRSA Assay (FDA     approved for NASAL specimens     only), is one component of a     comprehensive MRSA colonization     surveillance program. It is not     intended to diagnose MRSA     infection nor to guide or     monitor treatment for     MRSA infections.  CULTURE, RESPIRATORY (NON-EXPECTORATED)     Status: None   Collection Time    07/11/13 11:49 PM      Result Value Range Status   Specimen Description TRACHEAL ASPIRATE   Final   Special Requests NONE   Final   Gram Stain     Final   Value: NO WBC SEEN     NO SQUAMOUS EPITHELIAL CELLS SEEN     RARE YEAST     Performed at [facility]   Culture     Final   Value: ABUNDANT CANDIDA ALBICANS     Performed at [facility]   Report Status 07/17/2013 FINAL   Final     Labs: Basic Metabolic Panel:  Recent Labs Lab 07/12/13 0930 07/13/13 0410  NA 150* 153*  K 3.7 4.3  CL 106 107  CO2 27 27  GLUCOSE 156* 100*  BUN 65* 74*  CREATININE 3.50* 4.28*  CALCIUM 5.8* 5.9*   Liver Function Tests:  Recent Labs Lab 07/12/13 0930  AST 702*  ALT 698*  ALKPHOS 78  BILITOT 1.6*  PROT 4.8*  ALBUMIN 1.9*   No results found for this basename: LIPASE, AMYLASE,  in the last 168 hours No results found for this basename: AMMONIA,  in the last 168 hours CBC:  Recent Labs Lab 07/12/13 0930 07/13/13 0410  WBC 31.8* 35.3*  HGB 9.1* 9.1*   HCT 26.1* 26.4*  MCV 84.5 84.9  PLT 23* 26*   Cardiac Enzymes: No results found for this basename: CKTOTAL, CKMB, CKMBINDEX, TROPONINI,  in the last 168 hours BNP: BNP (last 3 results) No results found for this basename: PROBNP,  in the last 8760 hours CBG:  Recent Labs Lab 07/13/13 2116 07/13/13 2352 07/14/13 0411 07/14/13 0752 07/14/13 1147  GLUCAP 102* 101* 90 102* 105*    Signed:  FELIZ ORTIZ, Roberta Carpenter  [facility] 2013-07-23, 12:58 PM

## 2013-07-18 NOTE — Progress Notes (Signed)
Inpatient Room 6E06 [facility] of [facility] RN Admission Note  Pt admitted to Hospice services today under GIP status Hospice Dx: MS PPS-10% Code status: DNR Attending: Dr Ladona Ridgel  Pt admitted to [facility] on 12/16 following her going into Cardiac Arrest at home.  She did require intubation.  12/19 she was extubated.  Plan is comfort care.  Pt to remain in the hospital for terminal care.  Pt lying in the bed with eyes closed when writer arrived.  Respirations are 12 a minute.  + for rhonchi.  She did not stir while writer taking VS and listening to lungs and abdomen.  Brother-in-law arrived during visit.  Family reported to be taking turns staying with the pt.    Please contact [facility] at [phone] with any Hospice concerns.  Elijah Birk RN, [facility] RN, [facility] of [place] [phone]

## 2013-07-18 NOTE — Progress Notes (Signed)
Hospice and Palliative Care of  [facility]) Social Work note:  Patient admitted to hospice services today under GIP status Hospice diagnosis: MS (340) Attending Physician: Dr. Ladona Ridgel  Met with patient's daughter Leavy Cella, brother Kevin Fenton and his spouse to explain hospice services and offer support. They expressed appreciation for Dr. Lamar Blinks explanation of benefit. Family pleased with care patient receiving, report she is more comfortable today than yesterday. They are pleased to be admitted to hospice and appreciative of support. Patient has two daughters and six grands. Provided grief support packets for children and adults, made them aware of support services. Affirmed their devotion to patient and support of each other. They are aware Dr. Ladona Ridgel is attending and that [facility] RNs will see patient daily. Will follow up Friday. Please do not hesitate to contact me for support to this family. Thank you. Forrestine Him LCSW [phone]

## 2013-07-19 DIAGNOSIS — K1370 Unspecified lesions of oral mucosa: Secondary | ICD-10-CM

## 2013-07-19 MED ORDER — LORAZEPAM 2 MG/ML IJ SOLN
0.5000 mg | INTRAMUSCULAR | Status: DC | PRN
  Administered 2013-07-20: 0.5 mg via INTRAVENOUS
  Filled 2013-07-19: qty 1

## 2013-07-19 NOTE — Progress Notes (Signed)
Patient Roberta Carpenter      DOB: 23-May-1961      [id number]   Palliative Medicine Team at [facility] Progress Note    Subjective: Roberta Carpenter was resting with eyes closed when I arrived.  She does rouse to tactile and verbal stimuli and will make a soft vocalization.  Her respirations appear comfortable.  Family was updated at the bedside.   There were no vitals filed for this visit. Physical exam:  General: mild distress when awake she will make a continuous vocalization, hum/moan. Pupils equal, mild chemosis again noted on left eye with some yellowing that is not worse. Mucous membranes dry, wiped large amount of yellow phlegm from mouth and applied mouth moisturizer Chest: bilateral soft crackles Cvs: regular, S1, S2 Abd: not distended Ext: contracted Neuro: mildly rousable with tactile and verbal stimuli      Assessment and plan: 52 yr old african [place] female with MS extubated to comfort care after out of hospital cardiac arrest.  Symptoms being managed are dyspnea, secretions, and will add ativan for seeming anxiety  1.  DNR/DNI  2.  Anxiety as expressed through vocaliztion: add prn ativan  3.  Dyspnea/tachypnea:  Add ativan prn  Total time 25 min  ; 730-755 am  Roberta Molner L. Ladona Ridgel, MD MBA The Palliative Medicine Team at [facility] Phone: [phone] Pager: [phone]

## 2013-07-19 NOTE — Plan of Care (Signed)
Problem: Phase I Progression Outcomes Goal: Voiding-avoid urinary catheter unless indicated Outcome: Not Met (add Reason) Pt has a foley catheter for end of life care.

## 2013-07-20 ENCOUNTER — Encounter (HOSPITAL_COMMUNITY): Payer: Self-pay | Admitting: Emergency Medicine

## 2013-07-20 MED ORDER — LORAZEPAM 2 MG/ML IJ SOLN
0.5000 mg | INTRAMUSCULAR | Status: DC
  Administered 2013-07-20 – 2013-07-26 (×39): 0.5 mg via INTRAVENOUS
  Filled 2013-07-20 (×39): qty 1

## 2013-07-20 NOTE — Progress Notes (Signed)
Inpatient Room 6E06, [facility] Palliative [facility] of [facility] Nurse Visit  Pt admitted to Hospice under GIP status on 05-Aug-2013. She is actively dying.  Comfort Care only.   Spoke with Dr Ladona Ridgel.  She reported that the pt was moaning this am.  Ativan has been scheduled.  Pt lying in bed with eyes closed.  Breathing rhythmic.  No evidence of discomfort.  Pt's brother and Brother-in-Law present.  They both reported that they felt that the pt is comfortable.  Per Pt's brother he just wants to make sure that she has no pain.  Family has no questions or concerns at this time.   Writer reinforced Hospices role.    Pt is a hospice Pt under GIP status.  Please Call [facility] at [phone] with any hospice related concerns.  Elijah Birk RN, [facility] RN, [facility] of [place] [phone]

## 2013-07-20 NOTE — Progress Notes (Signed)
   CARE MANAGEMENT NOTE 07/20/2013  Patient:  Roberta Carpenter, Roberta Carpenter   Account Number:  [id number]  Date Initiated:  07/20/2013  Documentation initiated by:  [facility]  Subjective/Objective Assessment:   Multiple sclerosis  s/p Cardiac Arrest  Respiratory failure     Action/Plan:   [facility] active   Anticipated DC Date:  07/21/2013   Anticipated DC Plan:  HOME W HOSPICE CARE      DC Planning Services  CM consult      Choice offered to / List presented to:             Status of service:  Completed, signed off Medicare Important Message given?   (If response is "NO", the following Medicare IM given date fields will be blank) Date Medicare IM given:   Date Additional Medicare IM given:    Discharge Disposition:  HOSPICE MEDICAL FACILITY  Per UR Regulation:  Reviewed for med. necessity/level of care/duration of stay  If discussed at Long Length of Stay Meetings, dates discussed:    Comments:  07/20/2013 0951 Pt admitted to Hospice services today under GIP status. No NCM needs identified. Isidoro Donning RN Case Mgmt phone [phone]

## 2013-07-20 NOTE — Progress Notes (Addendum)
Patient [id number] Roberta Carpenter      DOB: [birth year]      [id number]   Palliative Medicine Team at [facility] Progress Note    Subjective: Roberta Carpenter was found this am to be moaning softly with increased work of breathing.  Asked nursing to bolus and use prn ativan.  Sister in law at bedside.  Stated she rested most of the night.   Filed Vitals:   07/19/13 1812  BP: 134/77  Pulse: 84  Temp: 97.5 F (36.4 C)  Resp: 16   Physical exam:  General:  Mild distress PERRL, EOMI, chemosis right eye, lips dry Chest some anterior rhonchi, CVS: regular rate and rhythm Abd soft, not tender Ext; edema, bilateral feet no mottling Neuro: minimally responsive.   Assessment and plan: 52 yr old african [place] female with MS dying s/p cardiac arrest with intubation and subsequent extubation to comfort.  1. DNR  2.  Anxiety: patient has had increase in episodes of moaning  And agiation. Will schedule ativan  3.  Dyspnea stable in general but this am with increased work of breathing bolus to be given.  Will uptitrate basilar rate if she continues to decline.   Total time 25 min 710-725 am

## 2013-07-20 NOTE — Progress Notes (Signed)
Nutrition Brief Note  Chart reviewed. Pt now transitioning to comfort care.  No further nutrition interventions warranted at this time.  Please re-consult as needed.   Shifra Swartzentruber MS, RD, LDN Pager: [phone] After-hours pager: [phone]    

## 2013-07-20 NOTE — Progress Notes (Addendum)
07/20/2013 0951 Pt admitted to Hospice services 07/24/2013 under GIP status. No NCM needs identified. Isidoro Donning RN Case Mgmt phone [phone]

## 2013-07-20 NOTE — Plan of Care (Signed)
Problem: Phase I Progression Outcomes Goal: Voiding-avoid urinary catheter unless indicated Outcome: Not Met (add Reason) Foley placed for comfort and EOL care

## 2013-07-21 DIAGNOSIS — F411 Generalized anxiety disorder: Secondary | ICD-10-CM

## 2013-07-21 NOTE — Progress Notes (Signed)
Patient AV:[id number] Roberta Carpenter      DOB: 08-30-1960      [id number]   Palliative Medicine Team at [facility] Progress Note    Subjective:  Very comfortable.  Family at the bedside. State no moaning or crying.  Filed Vitals:   07/21/13 1017  BP: 118/79  Pulse: 92  Temp: 97.5 F (36.4 C)  Resp: 16   Physical exam:   General: no acute distress, not opening eyes to tactile stimuli Pupils not examined Chest : decreased but clear CVS: regular, S,1 S2 Abd: soft no grimace Ext: 2+ edema Neuro: obtunded  Assessment and plan: 52 yr old with MS s/p cardiac arrest with resuscitation.  Comfort care   1.  DNR Comfort care 2.  Pain: continue Dilaudid drip, titrated as needed 3. Anxiety : on scheduled ativan  Total time 15 min  Jestin Burbach L. Ladona Ridgel, MD MBA The Palliative Medicine Team at [facility] Phone: [phone] Pager: [phone]

## 2013-07-22 NOTE — Progress Notes (Signed)
Pt resting comfortably.  Sister at bedside. Provided support to sister.  Wynonia Hazard, LCSW

## 2013-07-22 NOTE — Progress Notes (Signed)
Patient Roberta Carpenter      DOB: June 03, 1961      [id number]   Palliative Medicine Team at [facility] Progress Note    Subjective: Roberta Carpenter again looks peaceful and angelic.  No further crying out or moaning. Breathing comfortably.  Family  At bedside asked if her kidney's were failing yet.  She remains making clear yellow urine.   Filed Vitals:   07/22/13 0530  BP: 112/75  Pulse: 86  Temp: 97.3 F (36.3 C)  Resp: 19   Physical exam:   General : No distress, very peaceful Eyes not opening as before, tongue and lip slightly swollen, peeling Chest decreased but clear CVS: regular rate and rhythm S1, S2 Abd: soft no grimace Ext warm with some edema all extremities Neuro: obtunded.     Assessment and plan: 52 yr old with MS s/p cardiac arrest with resuscitation. Comfort care  1. DNR Comfort care  2. Pain: continue Dilaudid drip, titrated as needed  3. Anxiety : on scheduled ativan  Total time 15 min 1045- 1100 am Roshni Burbano L. Ladona Ridgel, MD MBA The Palliative Medicine Team at [facility] Phone: [phone] Pager: [phone]

## 2013-07-23 NOTE — Progress Notes (Signed)
[facility] Social Work note: Chart reviewed from weekend and met with patient's mother Britta Mccreedy at bedside. Britta Mccreedy very engaging, expressed appreciation for "opportunity to talk." She recounted multiples losses, details of end of life experiences. She reports family concerned for two grandsons and one nephew who have been especially close to [place]. She expresses confidence that these young boys/men will get what they need from family and [facility] counseling which they have engaged in before. She describes strong, close, supportive family system and good support from friends. She expresses strong faith and strong family values. Britta Mccreedy is a long time caregiver of both daughters and other family members over the years. Affirmed her multiple roles/influences and encouraged self-care. Family continue to be very appreciative of all support offered, mentioning RN Mardelle Matte today. Family continues to maintain vigil, her son Arlys John will be here around 5pm. Rettie appears comfortable during this visit. Britta Mccreedy reports goals continue to be for good symptom management which they believe she is getting now. Nurse Tech in to check on her at end of this visit.Please do not hesitate to contact me for support to this family. Thank you. Forrestine Him LCsW [phone]

## 2013-07-23 NOTE — Progress Notes (Signed)
Inpatient Room 6E 06 [facility] RN Visit- M. Konrad Dolores, RN  Patient currently followed under [facility] GIP status; patient seen at bedside unresponsive to light touch or voice; RR=12-18 shallow,even no audible upper airway congestion;  per morning VS: O2 sat =87% on 2.5 LNC; HR =74 BP=128/77; LE warm, hadns cool to touch, poor capillary refill, bilateral feet 1-2+ edema Oragel lip gel placed on dry cracked lips; mother Roberta Carpenter at bedside; stated she felt Roberta Carpenter was comfortable as she has not had any moaning or facial grimacing as she has had in days past - patient continues on Dilaudid 0.5 mg continuous drip with last prn bolus given on 12/26; Ativan 0.5 mg IV q 4hrs scheduled  Mother, Roberta Carpenter, shared that she is at peace, that she has great faith and believes 'God is in control' She shared that her older daughter also died from MS and that all the family has been supportive of one another, someone is always at [facility] bedside -emotional support offered; patient's brother Roberta Carpenter and s-i-l Roberta Carpenter arrived at end of visit;  Family voiced no concerns or questions at this time; voiced appreciation for staff care of Roberta Carpenter; family aware [facility] will continue to follow.  Please call [facility] @ [phone]- ask for RN Liaison or after hours,ask for on-call RN with any hospice needs.   Thank you.  Valente David, RN  [facility]  Hospice Liaison  [phone])

## 2013-07-23 NOTE — Progress Notes (Signed)
Patient Roberta Carpenter      DOB: Dec 13, 1960      MVE:[id number]   Palliative Medicine Team at [facility] Progress Note    Subjective: Patient is much quieter, facial features more relaxed, today with shallow respirations. Her family is at the bedside. No problems overnight. She appears very comfortable. Urine output still clear and vigorous  Filed Vitals:   07/23/13 0439  BP: 128/77  Pulse: 91  Temp: 97.7 F (36.5 C)  Resp: 19   Physical exam: Temporal wasting, lip slightly less swollen tongue more retracted lip peeling Chest decreased but clear Or avascular regular rate rhythm positive S1 and S2 don't appreciate an S3 or S4 Extremities are becoming less swollen Neurologically unresponsive to tactile or verbal stimuli   Assessment and plan:  52 yr old with MS s/p cardiac arrest with resuscitation. Comfort care  1. DNR Comfort care  2. Pain: continue Dilaudid drip, titrated as needed  3. Anxiety : on scheduled ativan  Total time 15 min 1045- 1100 am  [place] Welliver L. Ladona Ridgel, MD MBA   The Palliative Medicine Team at [facility] Phone: [phone]  Pager: [phone]

## 2013-07-24 NOTE — Progress Notes (Signed)
Inpatient Room 6E 06 [facility] RN Visit- M. Konrad Dolores, RN  Patient currently followed by [facility] admitted 08-04-2013 under GIP status with Dx: MS s/p cardiac arrest, respiratory failure extubated to comfort measures; patient seen at bedside, non-responsive, breathing regular, shallow RR=16; O2 @ 2.5 LNC, diaphorectic; brother-in-law Roberta Carpenter at bedside; Roberta Carpenter states, patient's brother, Beverely Pace spent the night and, as a whole family feels patient is comfortable they are concerned that with repositioning/ turning patient shows signs of discomfort and have asked staff to limit any patient movement; on this visit patient seems diaphoretic, hands cool moist, bedsheets damp, also note Pt with Head positioned to L with L side of face appearing more swollen than R; staff RN Mardelle Matte and CNA aware; Roberta Carpenter agreeable to staff repositioning and making sure pt dry; Clinical research associate at bedside and observed with repositioning pt does begin to moan with shallow grunting; Staff RN Mardelle Matte aware and will continue to assess for need for prn meds - pt currently on Dilaudid 0.5mg /hr continuous and scheduled Ativan   Please call [facility] @ [phone]- ask for RN Liaison or after hours,ask for on-call RN with any hospice needs.   Thank you.  Roberta David, RN  [facility]  Hospice Liaison  [phone])

## 2013-07-24 NOTE — Progress Notes (Signed)
Patient ZO:[id number] AJWA KIMBERLEY      DOB: 04/25/1961      EAV:[id number]   Palliative Medicine Team at [facility] Progress Note    Subjective: Chenell continues to stay with [place].  She is very comfortable.  She opened her eyes briefly per family when she was turned earlier, but to tactile and verbal stimuli she is not responsive.  Family at the bedside supported.     Filed Vitals:   07/23/13 0439  BP: 128/77  Pulse: 91  Temp: 97.7 F (36.5 C)  Resp: 19   Physical exam:  General: no acute distress Pupils not examined, mm dry Chest: decreased, but clear anteriorly CVS: Irregu, S1,S2 ABd: soft no grimace Ext: diuresis, trace to one edema Neuro: unresponsive  Assessment and plan: 52 yr old with MS admitted after cardiac arrest .  Family elected comfort care.  Patient under Hospice benefit.  Declining slowly each day.  Prognosis remains hours to days.   1.  DNR  2.  Dyspnea: stablized on dilaudid drip.  Bolus prn  3.  Secretions: stabilized on atropine and scopalomine  4.  Anxiety:  Scheduled ativan has improved moaning and agitation.  Total time 15 min  Desirea Mizrahi L. Ladona Ridgel, MD MBA The Palliative Medicine Team at [facility] Phone: [phone] Pager: [phone]

## 2013-07-25 DIAGNOSIS — Z515 Encounter for palliative care: Secondary | ICD-10-CM

## 2013-07-25 DIAGNOSIS — J189 Pneumonia, unspecified organism: Secondary | ICD-10-CM

## 2013-07-25 DIAGNOSIS — R0609 Other forms of dyspnea: Secondary | ICD-10-CM

## 2013-07-25 NOTE — Progress Notes (Signed)
Inpatient Room 6E 06 [facility] RN Visit- M. Konrad Dolores, RN   Patient currently followed by [facility] admitted 07/23/2013 under GIP status with Dx: MS s/p cardiac arrest, respiratory failure extubated to comfort measures;  patient seen at bedside, non-responsive, more cachetic with progressive temporal/muscle wasting; breathing this morning noted to be heavy, especially on expiration, shallow RR= 20-24, irregular pattern with occasional 5 sec pause; O2 @ 2.5 LNC O2, foley continues to have urine output now with increased cloudy sediment; Clinical research associate spoke with staff RN Darl Pikes who indicated patient had not yet received scheduled Ativan which could be reason for slightly labored respirations VS checked by staff RN BP=103/66 HR =96, RR=16 Axillary Temp =97.4, O2 sat=98% brother-in-law Tim and sister Gloriajean Dell at bedside indicate they feel patient had a restful night, continue to request minimal patient disruption as far as repositioning etc if possible; discussed possibility of prn medication to assist with patient comfort with care needs - family looking forward to talking with MD, Dr Phillips Odor to see patient this morning  - pt remains on Dilaudid 0.5mg /hr continuous and scheduled Ativan   Please call [facility] @ [phone]- ask for RN Liaison or after hours,ask for on-call RN with any hospice needs.  Thank you. Valente David, RN [facility] Hospice Liaison [phone])

## 2013-07-25 NOTE — Progress Notes (Signed)
Palliative Care Team at [facility] Progress Note   SUBJECTIVE: Currently, the patient is unresponsive. Periods of apnea noted. No grimace.  Interval Events: GIP admission on 07/13/2013, now day 7.  OBJECTIVE: Vital Signs: BP 103/66  Pulse 94  Temp(Src) 97.4 F (36.3 C) (Axillary)  Resp 16  Ht 5\' 8"  (1.727 m)  Wt 55.883 kg (123 lb 3.2 oz)  BMI 18.74 kg/m2  SpO2 98%   Intake and Output: 12/30 0701 - 12/31 0700 In: 2304 [P.O.:2300; I.V.:4] Out: 800 [Urine:800]  Physical Exam: General: Vital signs reviewed and noted. No acute distress. Unresponsive, extremely edematous, diaphoretic  Head: Jaw contractures, swollen MM  Lungs:  Dense course rhonchi, very shallow breathing. 5 sec apnea  Heart: IRIR. Tachy  Abdomen:  BS normoactive. Soft, Nondistended, non-tender.  No masses or organomegaly.  Extremities: + pretibial edema. Slightly mottled.    No Known Allergies  Medications: Scheduled Meds:  . LORazepam  0.5 mg Intravenous Q4H  . scopolamine  1 patch Transdermal Q72H    Continuous Infusions: . HYDROmorphone 0.5 mg/hr (07/24/13 1145)    PRN Meds: acetaminophen, acetaminophen, atropine, HYDROmorphone, sodium chloride  Stool Softner: NA, no PO  Palliative Performance Scale: 10 %   Pain Present?: no  If yes: Pain Score: unable to assess/10   Labs: CBC    Component Value Date/Time   WBC 35.3* 07/13/2013 0410   RBC 3.11* 07/13/2013 0410   HGB 9.1* 07/13/2013 0410   HCT 26.4* 07/13/2013 0410   PLT 26* 07/13/2013 0410   MCV 84.9 07/13/2013 0410   MCH 29.3 07/13/2013 0410   MCHC 34.5 07/13/2013 0410   RDW 16.1* 07/13/2013 0410   LYMPHSABS 2.2 07/10/2013 1042   MONOABS 0.6 07/10/2013 1042   EOSABS 0.0 07/10/2013 1042   BASOSABS 0.0 07/10/2013 1042    CMET     Component Value Date/Time   NA 153* 07/13/2013 0410   K 4.3 07/13/2013 0410   CL 107 07/13/2013 0410   CO2 27 07/13/2013 0410   GLUCOSE 100* 07/13/2013 0410   BUN 74* 07/13/2013 0410   CREATININE 4.28* 07/13/2013 0410   CALCIUM 5.9* 07/13/2013 0410   PROT 4.8* 07/12/2013 0930   ALBUMIN 1.9* 07/12/2013 0930   AST 702* 07/12/2013 0930   ALT 698* 07/12/2013 0930   ALKPHOS 78 07/12/2013 0930   BILITOT 1.6* 07/12/2013 0930   GFRNONAA 11* 07/13/2013 0410   GFRAA 13* 07/13/2013 0410    ASSESSMENT/ PLAN: Roberta Carpenter is declining today and has made a significant turn in approaching EOL. Labored breathing and diaphoresis noted by Hospice RN earlier, was given scheduled Ativan with improvement in symptoms. She is having periods of apnea, her BP is lower. She has significant distress with minimal movement so family has been guarding her in terms of touching, moving and repositioning. She has significant diuresis- urine in foley bag very light with  white sediment.    Instructed RN to give Dilaudid bolus prior to any movement or repositioning  Obtain vital signs at least daily-family education provided  Continue scheduled Ativan.   Time: 25 minutes. Greater than 50%  of this time was spent counseling and coordinating care related to the above assessment and plan.   Edsel Petrin, DO  07/25/2013, 10:09 AM  Please contact Palliative Medicine Team phone at [phone] for questions and concerns.

## 2013-07-26 ENCOUNTER — Encounter (HOSPITAL_COMMUNITY): Payer: Self-pay

## 2013-07-26 DIAGNOSIS — E43 Unspecified severe protein-calorie malnutrition: Secondary | ICD-10-CM | POA: Diagnosis present

## 2013-07-26 DIAGNOSIS — A419 Sepsis, unspecified organism: Secondary | ICD-10-CM

## 2013-07-26 MED ORDER — HYDROMORPHONE BOLUS VIA INFUSION
1.0000 mg | INTRAVENOUS | Status: DC | PRN
  Administered 2013-07-26 (×2): 1 mg via INTRAVENOUS
  Filled 2013-07-26: qty 1

## 2013-07-26 NOTE — Progress Notes (Signed)
[facility] [place] Work note: On call MSW met with mother-Barbara. Pt appeared comfortable with no grimace on face. Pt appeared to have no distress. Mother stated that family continues to remain at pt's bedside at all times. Mother open about her anticipatory grief issues. Mother tearful as she shared stories about pt. Mother continued to express concern for pt's 2 nephews in their coping. MSW educated mother on [facility] services. MSW offered active listening and support. MSW discussed the importance of having closure with pt. Please call with questions or concerns.   Marilynne Halsted, MSW [phone]

## 2013-07-26 NOTE — Progress Notes (Signed)
[facility] at [facility] Progress Note   SUBJECTIVE: Currently, the patient is unresponsive. Periods of apnea noted. No grimace.  Interval Events: GIP admission on 07/05/2013, now day 8.  OBJECTIVE: Vital Signs: BP 104/65  Pulse 91  Temp(Src) 97.7 F (36.5 C) (Axillary)  Resp 20  Ht 5\' 8"  (1.727 m)  Wt 55.883 kg (123 lb 3.2 oz)  BMI 18.74 kg/m2  SpO2 93%   Intake and Output: 12/31 0701 - 01/01 0700 In: 0  Out: 650 [Urine:650]  Physical Exam: General: Vital signs reviewed and noted. No acute distress. Unresponsive, edematous  Head: Jaw contractures, swollen MM  Lungs:  Dense audible rhonchi, very shallow breathing.   Heart: Tachy  Abdomen:  Soft  Extremities: + pretibial edema. Slightly mottled.    No Known Allergies  Medications: Scheduled Meds:  . LORazepam  0.5 mg Intravenous Q4H  . scopolamine  1 patch Transdermal Q72H    Continuous Infusions: . HYDROmorphone 0.5 mg/hr (08/22/2013 1256)    PRN Meds: acetaminophen, acetaminophen, atropine, HYDROmorphone, sodium chloride  Stool Softner: NA, no PO  Palliative Performance Scale: 10 %   Pain Present?: no  If yes: Pain Score: unable to assess/10   Labs: CBC    Component Value Date/Time   WBC 35.3* 07/13/2013 0410   RBC 3.11* 07/13/2013 0410   HGB 9.1* 07/13/2013 0410   HCT 26.4* 07/13/2013 0410   PLT 26* 07/13/2013 0410   MCV 84.9 07/13/2013 0410   MCH 29.3 07/13/2013 0410   MCHC 34.5 07/13/2013 0410   RDW 16.1* 07/13/2013 0410   LYMPHSABS 2.2 07/10/2013 1042   MONOABS 0.6 07/10/2013 1042   EOSABS 0.0 07/10/2013 1042   BASOSABS 0.0 07/10/2013 1042    CMET     Component Value Date/Time   NA 153* 07/13/2013 0410   K 4.3 07/13/2013 0410   CL 107 07/13/2013 0410   CO2 27 07/13/2013 0410   GLUCOSE 100* 07/13/2013 0410   BUN 74* 07/13/2013 0410   CREATININE 4.28* 07/13/2013 0410   CALCIUM 5.9* 07/13/2013 0410   PROT 4.8* 07/12/2013 0930   ALBUMIN 1.9* 07/12/2013 0930   AST 702*  07/12/2013 0930   ALT 698* 07/12/2013 0930   ALKPHOS 78 07/12/2013 0930   BILITOT 1.6* 07/12/2013 0930   GFRNONAA 11* 07/13/2013 0410   GFRAA 13* 07/13/2013 0410    ASSESSMENT/ PLAN: Darel HongJudy is declining- approaching EOL. Overall appears much more comfortable after changes made yesterday-she is less diaphoretic and actually looks the most comfortable I have seen her since admission. UOP has slowed down. Her breathing is non-labored but very shallow. Family at bedside. Provided support.  Continue bolus doses of dilaudid with movement  Continue scheduled Ativan   Time: 15 minutes. Greater than 50%  of this time was spent counseling and coordinating care related to the above assessment and plan.   Edsel PetrinElizabeth L Quashawn Jewkes, DO  08/06/2013, 3:49 PM  Please contact Palliative Medicine Team phone at [phone] for questions and concerns.

## 2013-07-26 DEATH — deceased

## 2013-07-28 NOTE — Progress Notes (Signed)
I have reviewed and discussed the care of this patient in detail with the nurse practitioner including pertinent patient records, physical exam findings and data. I agree with details of this encounter.  

## 2013-08-11 NOTE — Discharge Summary (Signed)
Death Summary  Roberta Carpenter ZOX:[id number] DOB: 29-Mar-1961 DOA: 07/08/2013  PCP: Provider Not In System PCP/Office notified: yes  Admit date: 07/19/2013 Date of Death: 08/11/2013  Final Diagnoses:  Active Problems:   Sepsis   Multiple sclerosis   Severe malnutrition  Hospital Course:  53 yo woman with advanced MS, bedbound but cared for at home prior to admission by a loving and devoted family. She unfortunately suffered a cardiac arrest at home on 12/16 and was resucitated by EMS and had a prolonged ICU stay with minimal improvement. She was terminally extubated on 07/09/13 and [facility] for management of dyspnea and pain at end of life.  She expired on 08/16/2013 at 23:01. Family at bedside. RN pronounced. Death certificate was completed and placed on the chart.   Time: N/A, RN pronounced. I did not evaluate patient post mortem.  SignedAnderson Malta:  [name],[name]  [facility] 08/11/2013, 4:00 PM

## 2013-08-26 NOTE — Progress Notes (Signed)
68 mL IV Dilaudid drip wasted in sink; verified by Artemio Alyheryl Marshall, RN and Sharen Heckebecca Sparks, RN.

## 2013-08-26 DEATH — deceased

## 2015-05-13 IMAGING — CT CT HEAD W/O CM
1 series · 16 of 30 positions shown, 20 images · non-contrast
Comparison: Brain MRI from [REDACTED] 05/16/2010

CLINICAL DATA: Altered mental status. Hypotension, fever,
ST-elevation, and respiratory failure following witnessed cardiac
arrest.

EXAM:
CT HEAD WITHOUT CONTRAST
TECHNIQUE: Contiguous axial images were obtained from the base of the skull
through the vertex without intravenous contrast.

[Series 2: head 5.0 h30s · axial · 0.43mm/px · z∈[+97,+232]mm · 16 of 30 slices shown, 20 images]
[im 2/30  brain]
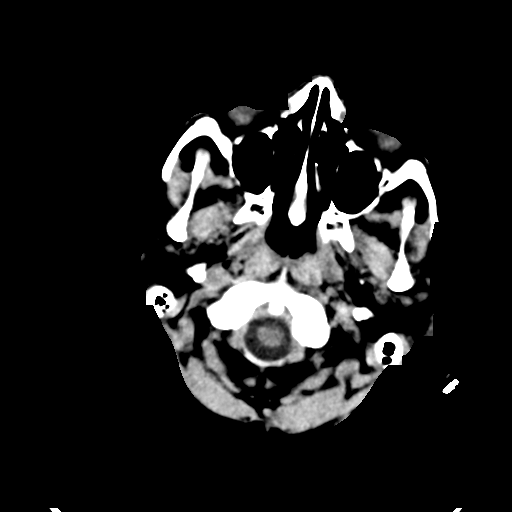
[im 2/30  bone]
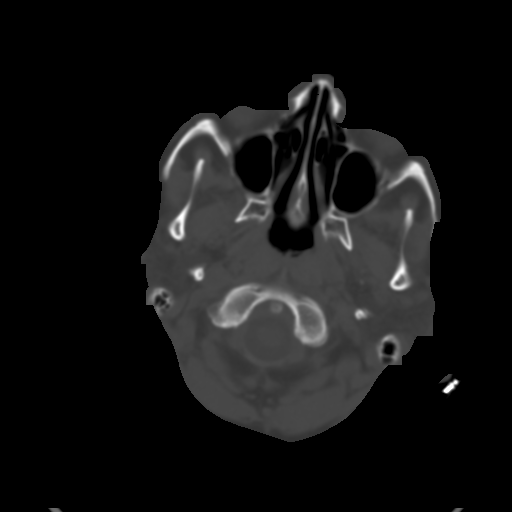
[im 4/30  brain]
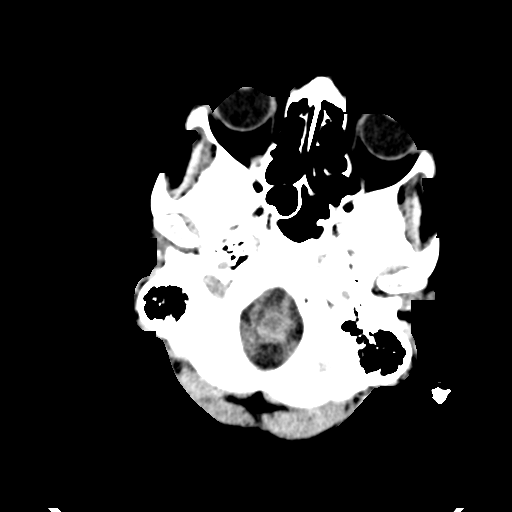
[im 6/30  brain]
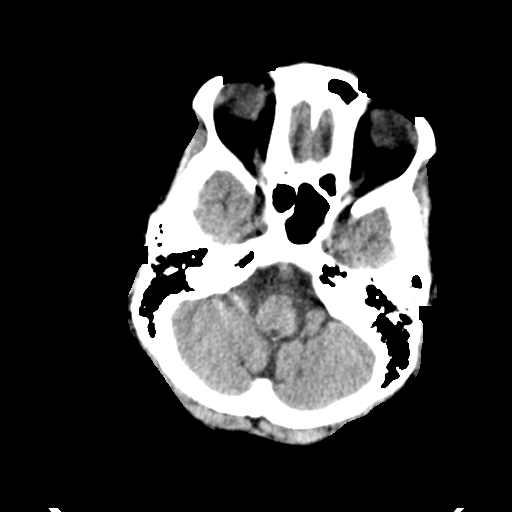
[im 8/30  brain]
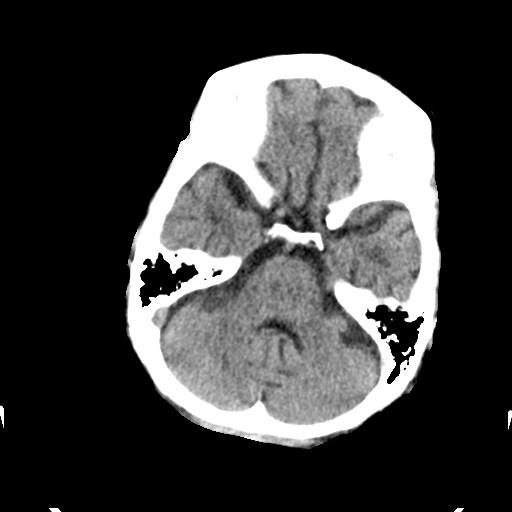
[im 9/30  brain]
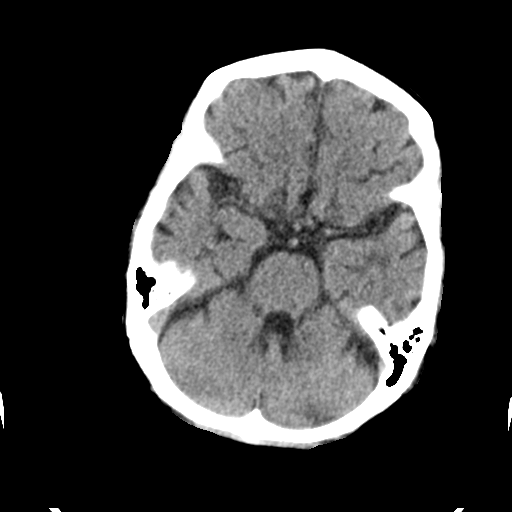
[im 9/30  bone]
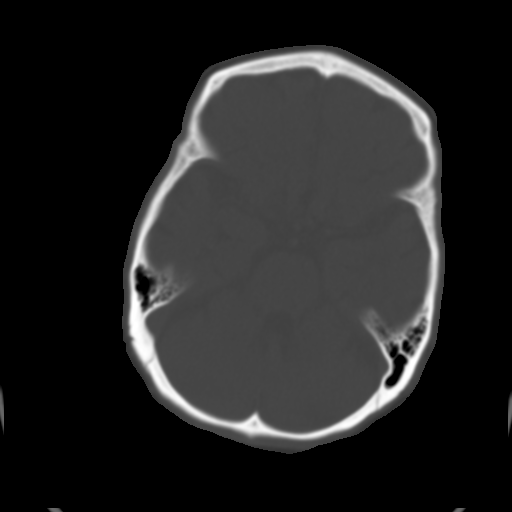
[im 11/30  brain]
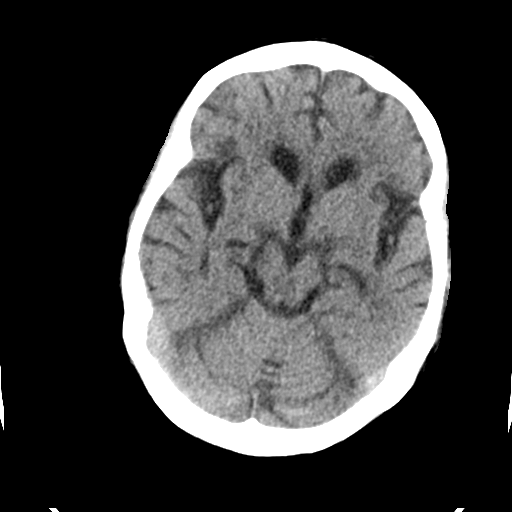
[im 13/30  brain]
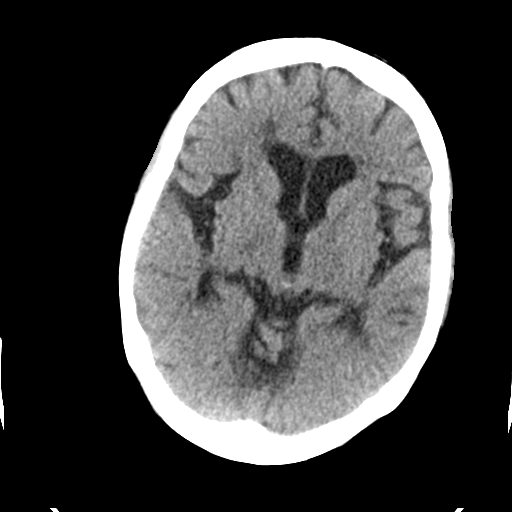
[im 15/30  brain]
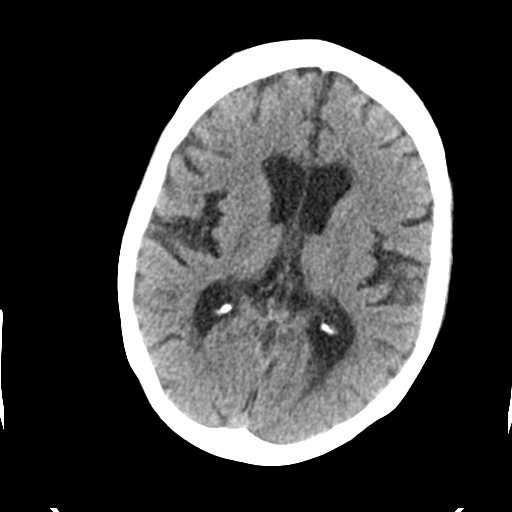
[im 16/30  brain]
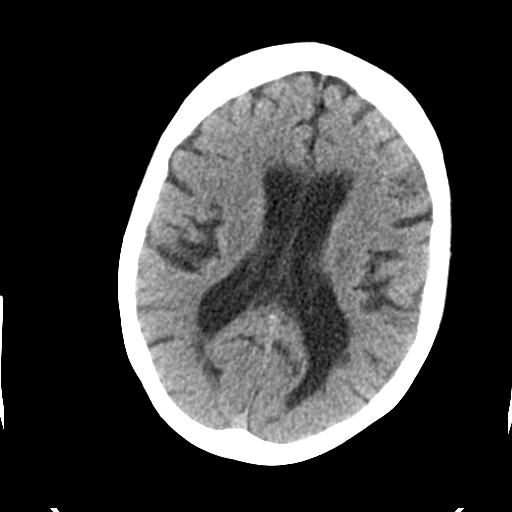
[im 16/30  bone]
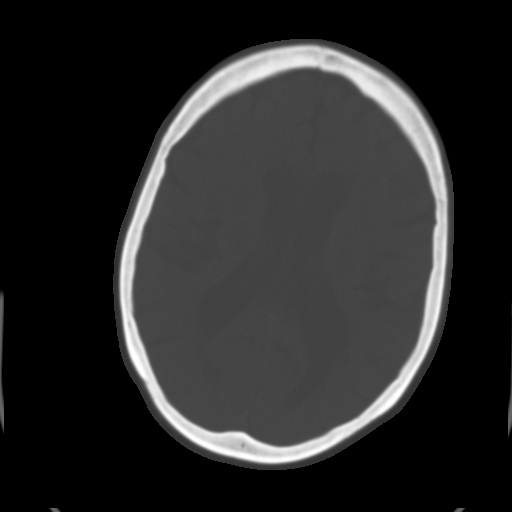
[im 18/30  brain]
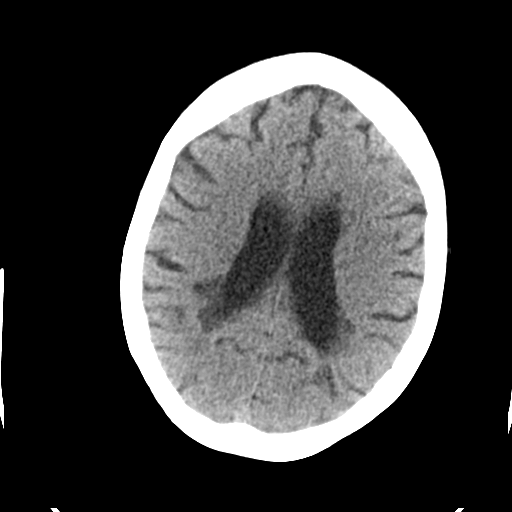
[im 20/30  brain]
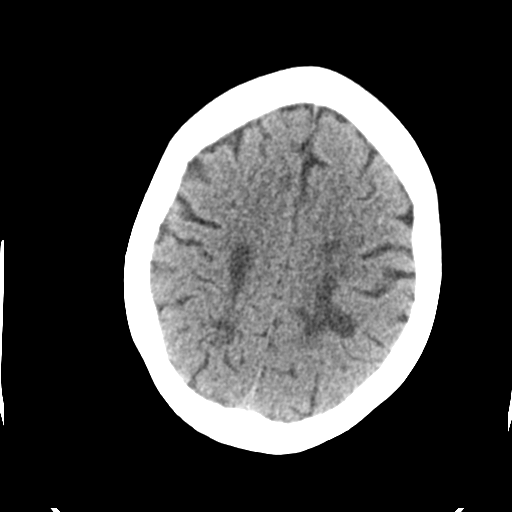
[im 22/30  brain]
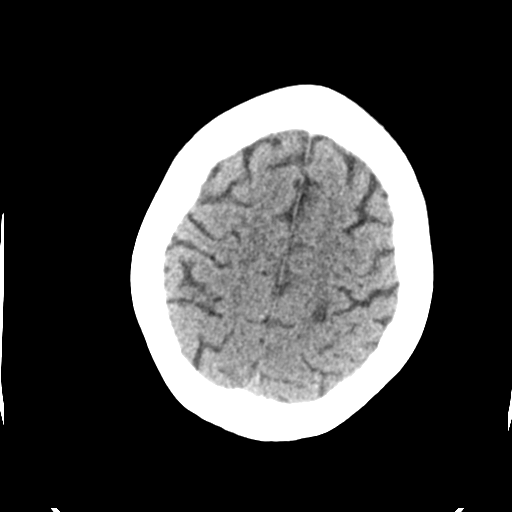
[im 23/30  brain]
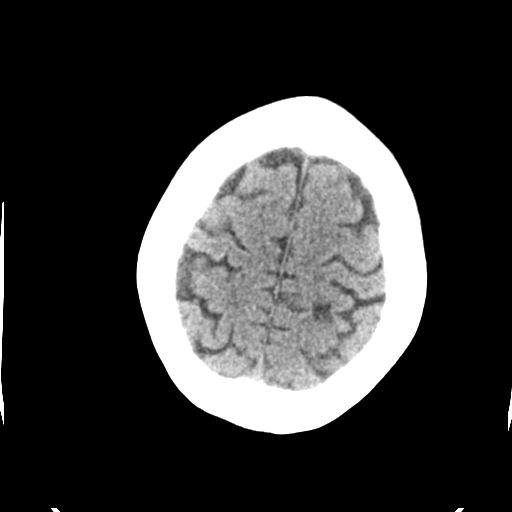
[im 23/30  bone]
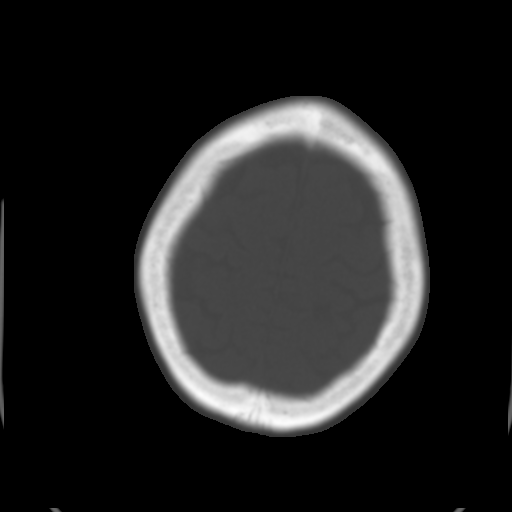
[im 25/30  brain]
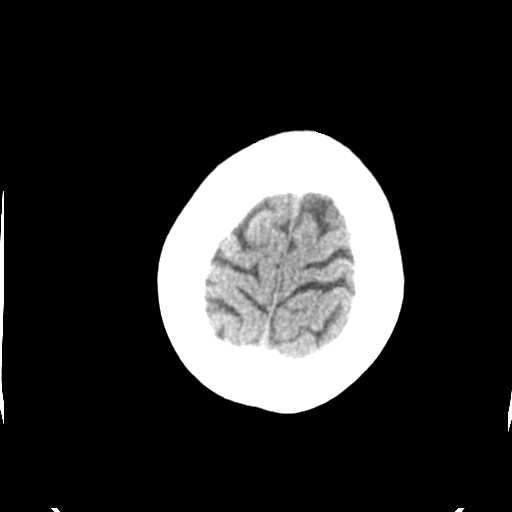
[im 27/30  brain]
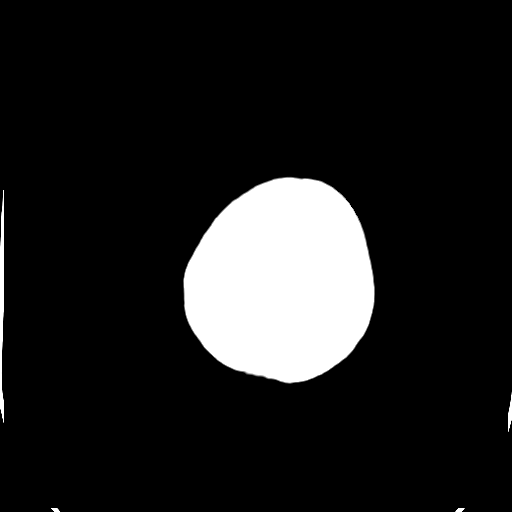
[im 29/30  brain]
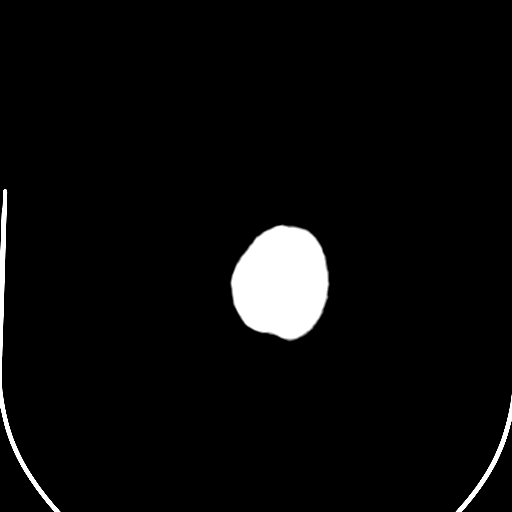

[16 of 30 positions shown; findings below may reference images not displayed]

FINDINGS: There is advanced cerebral atrophy for age with particular
prominence of the ventricles. Multiple periventricular hypodensities
are seen, corresponding to the T2 hyperintense lesions described on
prior MRI. There is no evidence of acute large territory infarct,
mass, midline shift, intracranial hemorrhage, or extra-axial fluid
collection. Visualized paranasal sinuses and mastoid air cells are
clear. Orbits are unremarkable. No acute fracture identified.
IMPRESSION: 1. No acute intracranial abnormality identified.
2. Periventricular white matter hypodensities and cerebral atrophy,
consistent with history of multiple sclerosis.

## 2015-05-14 IMAGING — CR DG CHEST 1V PORT
1 series · 1 of 1 positions shown · non-contrast
Comparison: Prior chest x-ray 06/30/2013

CLINICAL DATA: Feeding tube placement

EXAM:
PORTABLE CHEST - 1 VIEW

[AP]
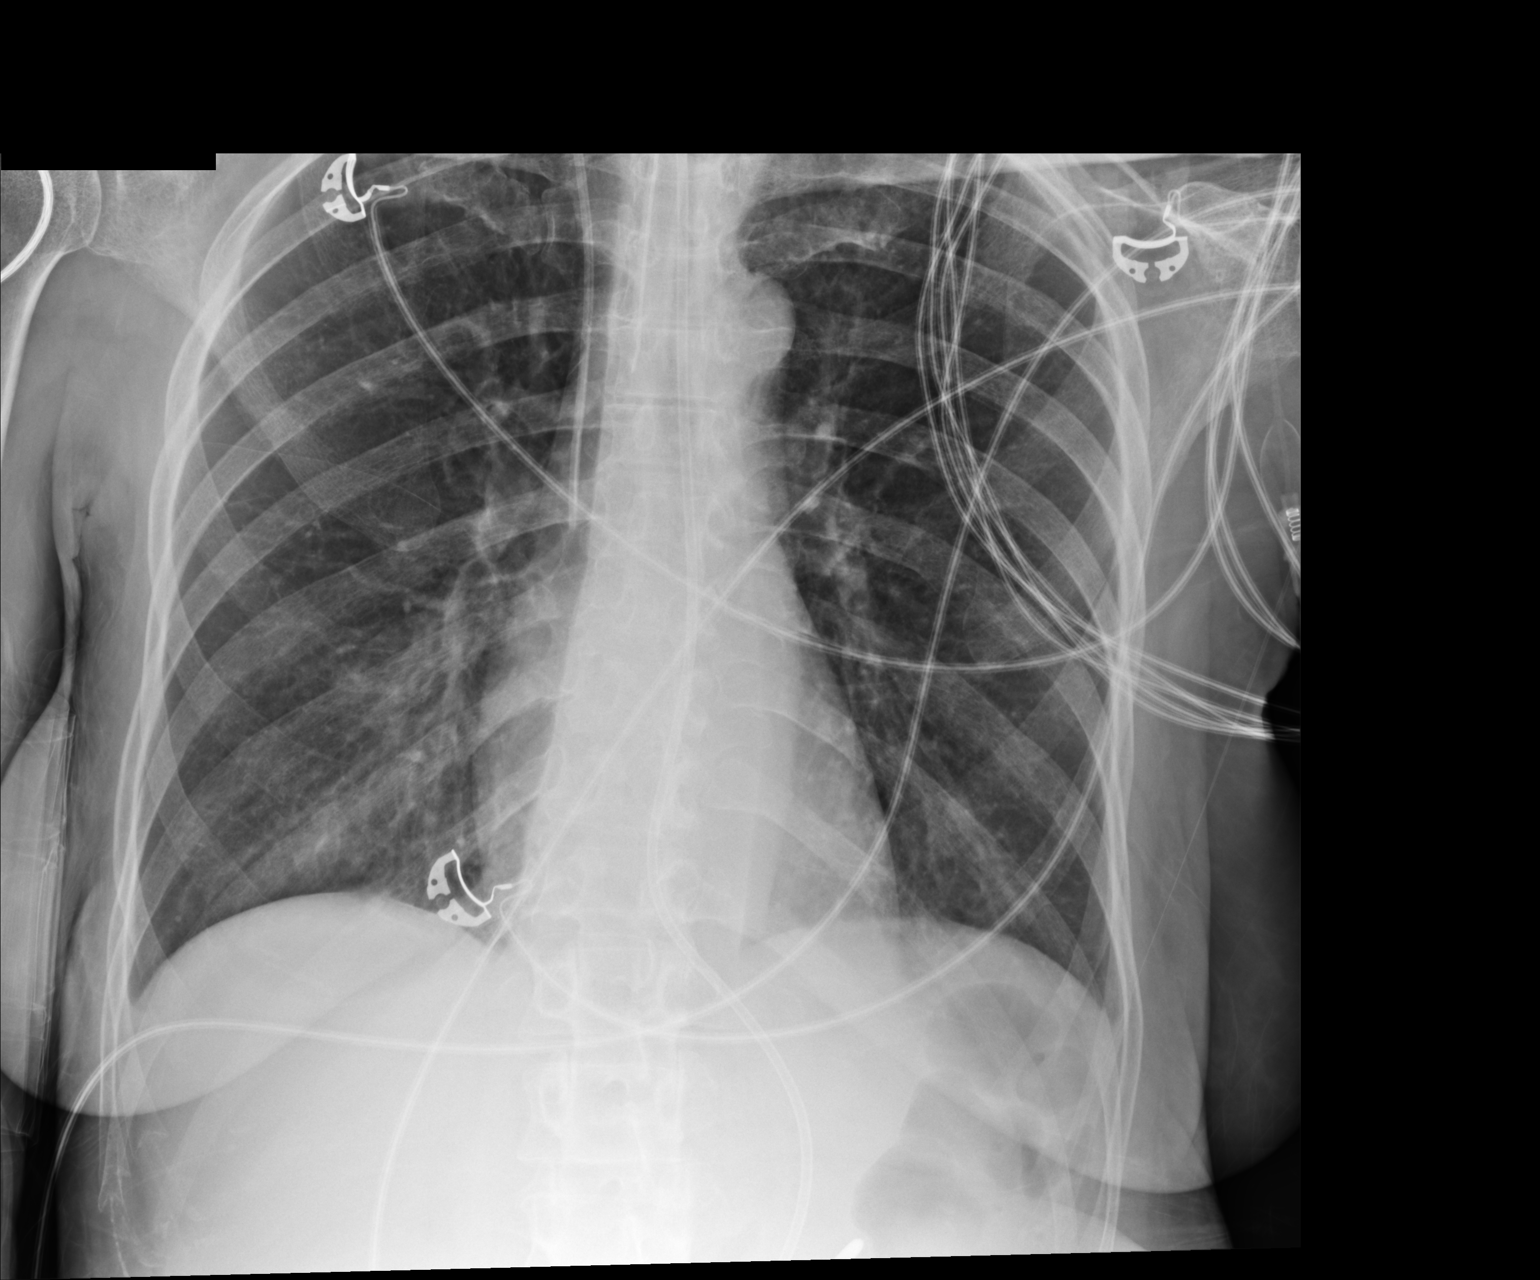

[1 of 1 positions shown; findings below may reference images not displayed]

FINDINGS: The tip of the endotracheal tube is 1 cm above the carina. The right
IJ central venous catheter is in unchanged position with the tip in
the distal SVC. A new enteric feeding tube is present. The tip is
incompletely imaged and is likely within the stomach. The
nasogastric tube has been removed. Mild bibasilar atelectasis.
Otherwise, the lungs are clear. Cardiac and mediastinal contours are
within normal limits. No pleural effusion or pneumothorax.
IMPRESSION: 1. The tip of the endotracheal tube is 1 cm above the carina.
Consider withdrawing 2 cm for more optimal placement.
2. Interval placement of an enteric feeding tube. The tip is
incompletely visualized at the bottom of the film and is likely
still within the stomach consider abdominal radiograph to confirm
location.
